# Patient Record
Sex: Female | Born: 1995 | Race: Black or African American | Hispanic: No | Marital: Single | Smoking: Never smoker
Health system: Southern US, Community
[De-identification: ages and names within clinical notes are randomized; demographics above are authoritative.]

## PROBLEM LIST (undated history)

## (undated) DIAGNOSIS — T7840XA Allergy, unspecified, initial encounter: Secondary | ICD-10-CM

## (undated) DIAGNOSIS — L309 Dermatitis, unspecified: Secondary | ICD-10-CM

## (undated) HISTORY — PX: WISDOM TOOTH EXTRACTION: SHX21

## (undated) HISTORY — DX: Allergy, unspecified, initial encounter: T78.40XA

## (undated) HISTORY — DX: Dermatitis, unspecified: L30.9

---
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
## 2015-01-03 ENCOUNTER — Ambulatory Visit: Payer: Self-pay

## 2015-01-07 ENCOUNTER — Telehealth: Payer: Self-pay | Admitting: Family Medicine

## 2015-01-07 ENCOUNTER — Other Ambulatory Visit: Payer: Self-pay

## 2015-01-07 MED ORDER — MEDROXYPROGESTERONE ACETATE 150 MG/ML IM SUSP: 150.0000 mg | INTRAMUSCULAR | Status: DC

## 2015-01-07 NOTE — Telephone Encounter (Signed)
Pt needs refill on her Depo-Provera 150mg /ml to [facility] [place].  Pt is scheduled to come in Wed. 07/7626@ 11:15am to have injection administered.

## 2015-01-09 ENCOUNTER — Ambulatory Visit: Payer: Self-pay

## 2015-01-14 ENCOUNTER — Ambulatory Visit: Payer: Medicaid Other

## 2015-01-14 DIAGNOSIS — Z3200 Encounter for pregnancy test, result unknown: Secondary | ICD-10-CM

## 2015-01-14 DIAGNOSIS — Z3049 Encounter for surveillance of other contraceptives: Secondary | ICD-10-CM

## 2015-01-14 NOTE — Progress Notes (Signed)
Erroneous encounter

## 2015-01-15 ENCOUNTER — Ambulatory Visit: Payer: Medicaid Other

## 2015-01-16 ENCOUNTER — Ambulatory Visit (INDEPENDENT_AMBULATORY_CARE_PROVIDER_SITE_OTHER): Payer: Medicaid Other

## 2015-01-16 DIAGNOSIS — Z3200 Encounter for pregnancy test, result unknown: Secondary | ICD-10-CM

## 2015-01-16 DIAGNOSIS — Z309 Encounter for contraceptive management, unspecified: Secondary | ICD-10-CM | POA: Diagnosis not present

## 2015-01-16 MED ORDER — MEDROXYPROGESTERONE ACETATE 150 MG/ML IM SUSP
150.0000 mg | Freq: Once | INTRAMUSCULAR | Status: AC
  Administered 2015-01-16: 150 mg via INTRAMUSCULAR

## 2015-04-17 ENCOUNTER — Ambulatory Visit (INDEPENDENT_AMBULATORY_CARE_PROVIDER_SITE_OTHER): Payer: Medicaid Other

## 2015-04-17 DIAGNOSIS — Z3049 Encounter for surveillance of other contraceptives: Secondary | ICD-10-CM

## 2015-04-17 DIAGNOSIS — Z3042 Encounter for surveillance of injectable contraceptive: Secondary | ICD-10-CM

## 2015-04-17 MED ORDER — MEDROXYPROGESTERONE ACETATE 150 MG/ML IM SUSP
150.0000 mg | Freq: Once | INTRAMUSCULAR | Status: AC
  Administered 2015-04-17: 150 mg via INTRAMUSCULAR

## 2015-05-29 ENCOUNTER — Telehealth: Payer: Self-pay

## 2015-05-29 NOTE — Telephone Encounter (Signed)
Tried to contact this patient to see if she was interested in getting her influenza vaccine, but there was no answer. A message was left for her to give [place] a call when she got the chance.

## 2015-07-11 ENCOUNTER — Ambulatory Visit: Payer: Medicaid Other

## 2015-07-11 DIAGNOSIS — Z3049 Encounter for surveillance of other contraceptives: Secondary | ICD-10-CM

## 2015-07-11 MED ORDER — MEDROXYPROGESTERONE ACETATE 150 MG/ML IM SUSP
150.0000 mg | Freq: Once | INTRAMUSCULAR | Status: AC
  Administered 2015-07-11: 150 mg via INTRAMUSCULAR

## 2015-07-18 ENCOUNTER — Ambulatory Visit: Payer: Medicaid Other

## 2015-10-08 ENCOUNTER — Ambulatory Visit (INDEPENDENT_AMBULATORY_CARE_PROVIDER_SITE_OTHER): Payer: Medicaid Other

## 2015-10-08 DIAGNOSIS — Z308 Encounter for other contraceptive management: Secondary | ICD-10-CM

## 2015-10-08 MED ORDER — MEDROXYPROGESTERONE ACETATE 150 MG/ML IM SUSP
150.0000 mg | Freq: Once | INTRAMUSCULAR | Status: AC
  Administered 2015-10-08: 150 mg via INTRAMUSCULAR

## 2015-12-05 ENCOUNTER — Ambulatory Visit (INDEPENDENT_AMBULATORY_CARE_PROVIDER_SITE_OTHER): Payer: 59 | Admitting: Family Medicine

## 2015-12-05 ENCOUNTER — Encounter: Payer: Self-pay | Admitting: Family Medicine

## 2015-12-05 DIAGNOSIS — J302 Other seasonal allergic rhinitis: Secondary | ICD-10-CM | POA: Insufficient documentation

## 2015-12-05 DIAGNOSIS — L219 Seborrheic dermatitis, unspecified: Secondary | ICD-10-CM

## 2015-12-05 DIAGNOSIS — J3089 Other allergic rhinitis: Secondary | ICD-10-CM

## 2015-12-05 DIAGNOSIS — Z91010 Allergy to peanuts: Secondary | ICD-10-CM | POA: Insufficient documentation

## 2015-12-05 DIAGNOSIS — E559 Vitamin D deficiency, unspecified: Secondary | ICD-10-CM | POA: Insufficient documentation

## 2015-12-05 DIAGNOSIS — L210 Seborrhea capitis: Secondary | ICD-10-CM

## 2015-12-05 DIAGNOSIS — L309 Dermatitis, unspecified: Secondary | ICD-10-CM | POA: Insufficient documentation

## 2015-12-05 MED ORDER — CLOBETASOL PROPIONATE 0.05 % EX SHAM: 15.0000 mL | MEDICATED_SHAMPOO | CUTANEOUS | Status: DC

## 2015-12-05 MED ORDER — SELENIUM SULFIDE 2.25 % EX SHAM
10.0000 mL | MEDICATED_SHAMPOO | CUTANEOUS | Status: DC
Start: 2015-12-05 — End: 2016-07-08

## 2015-12-05 NOTE — Progress Notes (Signed)
Name: Shelley Davis   MRN: [id number]    DOB: 1995-06-29   Date:12/05/2015       Progress Note  Subjective  Chief Complaint  Chief Complaint  Patient presents with  . Hair/Scalp Problem    patient stated that she has to wash her hair 2/week due to lots of flakes. she stated that it turns really red when she scratches it. she also mentioned that she has a hx of eczema    HPI  Seborrhea capitis: long history of dandruff, getting progressively worse, large flakes, at times scalp irritated and burns. She has tried otc medication without improvement of symptoms.    Patient Active Problem List   Diagnosis Date Noted  . Perennial allergic rhinitis with seasonal variation 12/05/2015  . Allergic to peanuts 12/05/2015  . Vitamin D deficiency 12/05/2015  . Eczema 12/05/2015    Past Surgical History  Procedure Laterality Date  . Wisdom tooth extraction Bilateral     Family History  Problem Relation Age of Onset  . Asthma Sister     Social History   Social History  . Marital Status: Single    Spouse Name: N/A  . Number of Children: N/A  . Years of Education: N/A   Occupational History  . Not on file.   Social History Main Topics  . Smoking status: Never Smoker   . Smokeless tobacco: Never Used  . Alcohol Use: No  . Drug Use: No  . Sexual Activity:    Partners: Male   Other Topics Concern  . Not on file   Social History Narrative  . No narrative on file     Current outpatient prescriptions:  .  EPINEPHrine (EPIPEN 2-PAK) 0.3 mg/0.3 mL IJ SOAJ injection, , Disp: , Rfl:  .  medroxyPROGESTERone (DEPO-PROVERA) 150 MG/ML injection, Inject 1 mL (150 mg total) into the muscle every 3 (three) months., Disp: 1 mL, Rfl: 4 .  Clobetasol Propionate 0.05 % shampoo, Apply 15 mLs topically 2 (two) times a week., Disp: 118 mL, Rfl: 0 .  Selenium Sulfide 2.25 % SHAM, Apply 10 mLs topically 2 (two) times a week., Disp: 180 mL, Rfl: 2  Allergies  Allergen Reactions  . Peanut Oil       ROS  Ten systems reviewed and is negative except as mentioned in HPI   Objective  Filed Vitals:   12/05/15 0827  BP: 102/62  Pulse: 98  Temp: 98.5 F (36.9 C)  TempSrc: Oral  Resp: 16  Height: 5' (1.524 m)  Weight: 151 lb 4.8 oz (68.629 kg)  SpO2: 97%    Body mass index is 29.55 kg/(m^2).  Physical Exam Constitutional: Patient appears well-developed No distress.  HEENT: head atraumatic, normocephalic, pupils equal and reactive to light,  neck supple, throat within normal limits Cardiovascular: Normal rate, regular rhythm and normal heart sounds.  No murmur heard. No BLE edema. Pulmonary/Chest: Effort normal and breath sounds normal. No respiratory distress. Abdominal: Soft.  There is no tenderness. Psychiatric: Patient has a normal mood and affect. behavior is normal. Judgment and thought content normal. Skin: large white flakes that is worse on hair lines and nuchal area, with clumps on scalp  PHQ2/9: Depression screen PHQ 2/9 12/05/2015  Decreased Interest 0  Down, Depressed, Hopeless 0  PHQ - 2 Score 0     Fall Risk: Fall Risk  12/05/2015  Falls in the past year? No     Functional Status Survey: Is the patient deaf or have difficulty hearing?: No  Does the patient have difficulty seeing, even when wearing glasses/contacts?: No Does the patient have difficulty concentrating, remembering, or making decisions?: No Does the patient have difficulty walking or climbing stairs?: No Does the patient have difficulty dressing or bathing?: No Does the patient have difficulty doing errands alone such as visiting a doctor's office or shopping?: No    Assessment & Plan  1. Seborrhea capitis  - Selenium Sulfide 2.25 % SHAM; Apply 10 mLs topically 2 (two) times a week.  Dispense: 180 mL; Refill: 2 - Clobetasol Propionate 0.05 % shampoo; Apply 15 mLs topically 2 (two) times a week.  Dispense: 118 mL; Refill: 0

## 2016-01-08 ENCOUNTER — Ambulatory Visit: Payer: Medicaid Other

## 2016-01-17 ENCOUNTER — Ambulatory Visit (INDEPENDENT_AMBULATORY_CARE_PROVIDER_SITE_OTHER): Payer: 59

## 2016-01-17 DIAGNOSIS — Z3042 Encounter for surveillance of injectable contraceptive: Secondary | ICD-10-CM

## 2016-01-17 DIAGNOSIS — Z3049 Encounter for surveillance of other contraceptives: Secondary | ICD-10-CM

## 2016-01-17 LAB — POCT URINE PREGNANCY: Preg Test, Ur: NEGATIVE

## 2016-01-17 MED ORDER — MEDROXYPROGESTERONE ACETATE 150 MG/ML IM SUSY
150.0000 mg | PREFILLED_SYRINGE | Freq: Once | INTRAMUSCULAR | Status: AC
  Administered 2016-01-17: 150 mg via INTRAMUSCULAR

## 2016-02-07 ENCOUNTER — Encounter: Payer: 59 | Admitting: Family Medicine

## 2016-04-14 ENCOUNTER — Other Ambulatory Visit: Payer: Self-pay | Admitting: Family Medicine

## 2016-04-15 ENCOUNTER — Ambulatory Visit: Payer: 59

## 2016-04-15 NOTE — Telephone Encounter (Signed)
[facility] 

## 2016-04-20 ENCOUNTER — Ambulatory Visit: Payer: 59

## 2016-04-22 NOTE — Telephone Encounter (Signed)
Annual physical scheduled for Feb.

## 2016-04-23 ENCOUNTER — Ambulatory Visit (INDEPENDENT_AMBULATORY_CARE_PROVIDER_SITE_OTHER): Payer: 59

## 2016-04-23 DIAGNOSIS — Z3042 Encounter for surveillance of injectable contraceptive: Secondary | ICD-10-CM

## 2016-04-23 MED ORDER — MEDROXYPROGESTERONE ACETATE 150 MG/ML IM SUSP
150.0000 mg | Freq: Once | INTRAMUSCULAR | Status: AC
  Administered 2016-04-23: 150 mg via INTRAMUSCULAR

## 2016-07-08 ENCOUNTER — Other Ambulatory Visit: Payer: Self-pay | Admitting: Family Medicine

## 2016-07-08 ENCOUNTER — Ambulatory Visit (INDEPENDENT_AMBULATORY_CARE_PROVIDER_SITE_OTHER): Payer: 59 | Admitting: Family Medicine

## 2016-07-08 ENCOUNTER — Encounter: Payer: Self-pay | Admitting: Family Medicine

## 2016-07-08 VITALS — BP 118/74 | HR 96 | Temp 97.8°F | Resp 16 | Ht 61.0 in | Wt 151.9 lb

## 2016-07-08 DIAGNOSIS — Z113 Encounter for screening for infections with a predominantly sexual mode of transmission: Secondary | ICD-10-CM

## 2016-07-08 DIAGNOSIS — Z01419 Encounter for gynecological examination (general) (routine) without abnormal findings: Secondary | ICD-10-CM | POA: Diagnosis not present

## 2016-07-08 DIAGNOSIS — N944 Primary dysmenorrhea: Secondary | ICD-10-CM

## 2016-07-08 DIAGNOSIS — R1110 Vomiting, unspecified: Secondary | ICD-10-CM | POA: Diagnosis not present

## 2016-07-08 DIAGNOSIS — Z23 Encounter for immunization: Secondary | ICD-10-CM | POA: Diagnosis not present

## 2016-07-08 DIAGNOSIS — E663 Overweight: Secondary | ICD-10-CM | POA: Diagnosis not present

## 2016-07-08 DIAGNOSIS — R5383 Other fatigue: Secondary | ICD-10-CM

## 2016-07-08 DIAGNOSIS — Z13220 Encounter for screening for lipoid disorders: Secondary | ICD-10-CM | POA: Diagnosis not present

## 2016-07-08 DIAGNOSIS — D229 Melanocytic nevi, unspecified: Secondary | ICD-10-CM | POA: Diagnosis not present

## 2016-07-08 DIAGNOSIS — E559 Vitamin D deficiency, unspecified: Secondary | ICD-10-CM | POA: Diagnosis not present

## 2016-07-08 DIAGNOSIS — IMO0001 Reserved for inherently not codable concepts without codable children: Secondary | ICD-10-CM

## 2016-07-08 DIAGNOSIS — Z124 Encounter for screening for malignant neoplasm of cervix: Secondary | ICD-10-CM

## 2016-07-08 DIAGNOSIS — Z3042 Encounter for surveillance of injectable contraceptive: Secondary | ICD-10-CM | POA: Diagnosis not present

## 2016-07-08 LAB — CBC WITH DIFFERENTIAL/PLATELET
BASOS ABS: 0 {cells}/uL (ref 0–200)
Basophils Relative: 0 %
EOS PCT: 3 %
Eosinophils Absolute: 171 cells/uL (ref 15–500)
HEMATOCRIT: 39.4 % (ref 35.0–45.0)
Hemoglobin: 12.8 g/dL (ref 11.7–15.5)
LYMPHS PCT: 44 %
Lymphs Abs: 2508 cells/uL (ref 850–3900)
MCH: 25.2 pg — AB (ref 27.0–33.0)
MCHC: 32.5 g/dL (ref 32.0–36.0)
MCV: 77.7 fL — ABNORMAL LOW (ref 80.0–100.0)
MONO ABS: 513 {cells}/uL (ref 200–950)
MPV: 8.5 fL (ref 7.5–12.5)
Monocytes Relative: 9 %
NEUTROS PCT: 44 %
Neutro Abs: 2508 cells/uL (ref 1500–7800)
Platelets: 358 10*3/uL (ref 140–400)
RBC: 5.07 MIL/uL (ref 3.80–5.10)
RDW: 14.6 % (ref 11.0–15.0)
WBC: 5.7 10*3/uL (ref 3.8–10.8)

## 2016-07-08 MED ORDER — RANITIDINE HCL 150 MG PO TABS: 150.0000 mg | ORAL_TABLET | Freq: Two times a day (BID) | ORAL | 0 refills | Status: DC

## 2016-07-08 MED ORDER — MEDROXYPROGESTERONE ACETATE 150 MG/ML IM SUSY: 1.0000 mL | PREFILLED_SYRINGE | INTRAMUSCULAR | 4 refills | Status: DC

## 2016-07-08 NOTE — Patient Instructions (Signed)
Gastroesophageal Reflux Disease, Adult Normally, food travels down the esophagus and stays in the stomach to be digested. However, when a person has gastroesophageal reflux disease (GERD), food and stomach acid move back up into the esophagus. When this happens, the esophagus becomes sore and inflamed. Over time, GERD can create small holes (ulcers) in the lining of the esophagus. What are the causes? This condition is caused by a problem with the muscle between the esophagus and the stomach (lower esophageal sphincter, or LES). Normally, the LES muscle closes after food passes through the esophagus to the stomach. When the LES is weakened or abnormal, it does not close properly, and that allows food and stomach acid to go back up into the esophagus. The LES can be weakened by certain dietary substances, medicines, and medical conditions, including:  Tobacco use.  Pregnancy.  Having a hiatal hernia.  Heavy alcohol use.  Certain foods and beverages, such as coffee, chocolate, onions, and peppermint.  What increases the risk? This condition is more likely to develop in:  People who have an increased body weight.  People who have connective tissue disorders.  People who use NSAID medicines.  What are the signs or symptoms? Symptoms of this condition include:  Heartburn.  Difficult or painful swallowing.  The feeling of having a lump in the throat.  Abitter taste in the mouth.  Bad breath.  Having a large amount of saliva.  Having an upset or bloated stomach.  Belching.  Chest pain.  Shortness of breath or wheezing.  Ongoing (chronic) cough or a night-time cough.  Wearing away of tooth enamel.  Weight loss.  Different conditions can cause chest pain. Make sure to see your health care provider if you experience chest pain. How is this diagnosed? Your health care provider will take a medical history and perform a physical exam. To determine if you have mild or severe  GERD, your health care provider may also monitor how you respond to treatment. You may also have other tests, including:  An endoscopy toexamine your stomach and esophagus with a small camera.  A test thatmeasures the acidity level in your esophagus.  A test thatmeasures how much pressure is on your esophagus.  A barium swallow or modified barium swallow to show the shape, size, and functioning of your esophagus.  How is this treated? The goal of treatment is to help relieve your symptoms and to prevent complications. Treatment for this condition may vary depending on how severe your symptoms are. Your health care provider may recommend:  Changes to your diet.  Medicine.  Surgery.  Follow these instructions at home: Diet  Follow a diet as recommended by your health care provider. This may involve avoiding foods and drinks such as: ? Coffee and tea (with or without caffeine). ? Drinks that containalcohol. ? Energy drinks and sports drinks. ? Carbonated drinks or sodas. ? Chocolate and cocoa. ? Peppermint and mint flavorings. ? Garlic and onions. ? Horseradish. ? Spicy and acidic foods, including peppers, chili powder, curry powder, vinegar, hot sauces, and barbecue sauce. ? Citrus fruit juices and citrus fruits, such as oranges, lemons, and limes. ? Tomato-based foods, such as red sauce, chili, salsa, and pizza with red sauce. ? Fried and fatty foods, such as donuts, french fries, potato chips, and high-fat dressings. ? High-fat meats, such as hot dogs and fatty cuts of red and white meats, such as rib eye steak, sausage, ham, and bacon. ? High-fat dairy items, such as whole milk,   butter, and cream cheese.  Eat small, frequent meals instead of large meals.  Avoid drinking large amounts of liquid with your meals.  Avoid eating meals during the 2-3 hours before bedtime.  Avoid lying down right after you eat.  Do not exercise right after you eat. General  instructions  Pay attention to any changes in your symptoms.  Take over-the-counter and prescription medicines only as told by your health care provider. Do not take aspirin, ibuprofen, or other NSAIDs unless your health care provider told you to do so.  Do not use any tobacco products, including cigarettes, chewing tobacco, and e-cigarettes. If you need help quitting, ask your health care provider.  Wear loose-fitting clothing. Do not wear anything tight around your waist that causes pressure on your abdomen.  Raise (elevate) the head of your bed 6 inches (15cm).  Try to reduce your stress, such as with yoga or meditation. If you need help reducing stress, ask your health care provider.  If you are overweight, reduce your weight to an amount that is healthy for you. Ask your health care provider for guidance about a safe weight loss goal.  Keep all follow-up visits as told by your health care provider. This is important. Contact a health care provider if:  You have new symptoms.  You have unexplained weight loss.  You have difficulty swallowing, or it hurts to swallow.  You have wheezing or a persistent cough.  Your symptoms do not improve with treatment.  You have a hoarse voice. Get help right away if:  You have pain in your arms, neck, jaw, teeth, or back.  You feel sweaty, dizzy, or light-headed.  You have chest pain or shortness of breath.  You vomit and your vomit looks like blood or coffee grounds.  You faint.  Your stool is bloody or black.  You cannot swallow, drink, or eat. This information is not intended to replace advice given to you by your health care provider. Make sure you discuss any questions you have with your health care provider. Document Released: 02/18/2005 Document Revised: 10/09/2015 Document Reviewed: 09/05/2014 Elsevier Interactive Patient Education  2017 [employer]  

## 2016-07-08 NOTE — Progress Notes (Signed)
Name: Shelley Davis   MRN: [id number]    DOB: 1996-01-24   Date:07/08/2016       Progress Note  Subjective  Chief Complaint  Chief Complaint  Patient presents with  . Annual Exam    HPI   Well Woman: she is sexually active, same partner for over one year, she does not have a history of STI. She is on Depo and also uses condoms. She drinks seldom and does not get drunk. She works two jobs, but is eating junk food and not exercising  Primary dysmenorrhea: she started Depo in 2015 for heavy and painful cycles. Doing well on medication, dose not recall LMP. She is aware that depo can cause bone loss. Explained importance of high calcium diet and vitamin D supplementation  Regurgitation: she has noticed that after meals she has a gagging sensation and fullness sensation on her chest, and is getting progressively worse, now it happens when not eating and also feels like she needs to catch her breath. No heartburn or weight loss, no abdominal pain or blood in stools. No change in bowel movements. No family history of thyroid disease  Fatigue: she feels tired at time, not taking any supplementation    Patient Active Problem List   Diagnosis Date Noted  . Primary dysmenorrhea 07/08/2016  . Perennial allergic rhinitis with seasonal variation 12/05/2015  . Allergic to peanuts 12/05/2015  . Vitamin D deficiency 12/05/2015  . Eczema 12/05/2015    Past Surgical History:  Procedure Laterality Date  . WISDOM TOOTH EXTRACTION Bilateral     Family History  Problem Relation Age of Onset  . Hypertension Mother   . Asthma Sister   . Lung disease Maternal Grandmother     Social History   Social History  . Marital status: Single    Spouse name: N/A  . Number of children: N/A  . Years of education: N/A   Occupational History  . 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 Smoking status: Never Smoker  . Smokeless tobacco: Never Used  . Alcohol use 0.6  oz/week    1 Shots of liquor per week     Comment: mixed drink occasionally  . Drug use: No  . Sexual activity: Yes    Partners: Male    Birth control/ protection: Injection   Other Topics Concern  . Not on file   Social History Narrative   She works two jobs, living with sister as room-mates   Dating [place] since end of 2016     Current Outpatient Prescriptions:  .  EPINEPHrine (EPIPEN 2-PAK) 0.3 mg/0.3 mL IJ SOAJ injection, , Disp: , Rfl:  .  MedroxyPROGESTERone Acetate 150 MG/ML SUSY, INJECT 1 MILLILITERS INTO MUSCLE EVERY 3 MONTHS, Disp: , Rfl: 0  Allergies  Allergen Reactions  . Peanut Oil      ROS  Constitutional: Negative for fever or weight change.  Respiratory: Negative for cough and intermittent  shortness of breath - feels like she needs to catch her breath    Cardiovascular: Negative for chest pain or palpitations.  Gastrointestinal: Negative for abdominal pain, no bowel changes.  Musculoskeletal: Negative for gait problem or joint swelling.  Skin: Negative for rash.  Neurological: Negative for dizziness or headache.  No other specific complaints in a complete review of systems (except as listed in HPI above).  Objective  Vitals:   07/08/16 0827  BP: 118/74  Pulse: 96  Resp:  16  Temp: 97.8 F (36.6 C)  TempSrc: Oral  SpO2: 99%  Weight: 151 lb 14.4 oz (68.9 kg)  Height: 5\' 1"  (1.549 m)    Body mass index is 28.7 kg/m.  Physical Exam  Constitutional: Patient appears well-developed and well-nourished. No distress.  HENT: Head: Normocephalic and atraumatic. Ears: B TMs ok, no erythema or effusion; Nose: Nose normal. Mouth/Throat: Oropharynx is clear and moist. No oropharyngeal exudate.  Eyes: Conjunctivae and EOM are normal. Pupils are equal, round, and reactive to light. No scleral icterus.  Neck: Normal range of motion. Neck supple. No JVD present. No thyromegaly present.  Cardiovascular: Normal rate, regular rhythm and normal heart sounds.  No  murmur heard. No BLE edema. Pulmonary/Chest: Effort normal and breath sounds normal. No respiratory distress. Abdominal: Soft. Bowel sounds are normal, no distension. There is no tenderness. no masses Breast: no lumps or masses, no nipple discharge or rashes FEMALE GENITALIA:  External genitalia normal External urethra normal Vaginal vault normal without discharge or lesions Cervix normal without discharge or lesions Bimanual exam normal without masses RECTAL: not done Musculoskeletal: Normal range of motion, no joint effusions. No gross deformities Neurological: he is alert and oriented to person, place, and time. No cranial nerve deficit. Coordination, balance, strength, speech and gait are normal. Skin: Skin is warm and dry. No rash noted. No erythema. Multiple moles - irregular on the back Psychiatric: Patient has a normal mood and affect. behavior is normal. Judgment and thought content normal.   PHQ2/9: Depression screen [facility] 2/9 07/08/2016 12/05/2015  Decreased Interest 0 0  Down, Depressed, Hopeless 0 0  PHQ - 2 Score 0 0     Fall Risk: Fall Risk  07/08/2016 12/05/2015  Falls in the past year? No No    Functional Status Survey: Is the patient deaf or have difficulty hearing?: No Does the patient have difficulty seeing, even when wearing glasses/contacts?: No Does the patient have difficulty concentrating, remembering, or making decisions?: No Does the patient have difficulty walking or climbing stairs?: No Does the patient have difficulty dressing or bathing?: No Does the patient have difficulty doing errands alone such as visiting a doctor's office or shopping?: No   Assessment & Plan  1. Well woman exam  Discussed importance of 150 minutes of physical activity weekly, eat two servings of fish weekly, eat one serving of tree nuts ( cashews, pistachios, pecans, almonds.Marland Kitchen) every other day, eat 6 servings of fruit/vegetables daily and drink plenty of water and avoid sweet  beverages.  - Lipid panel - Hemoglobin A1c - Insulin, fasting - COMPLETE METABOLIC PANEL WITH GFR - CBC with Differential/Platelet - VITAMIN D 25 Hydroxy (Vit-D Deficiency, Fractures) - Vitamin B12 - TSH  2. Primary dysmenorrhea  - MedroxyPROGESTERone Acetate 150 MG/ML SUSY; Inject 1 mL (150 mg total) into the muscle every 3 (three) months.  Dispense: 1 mL; Refill: 4  3. Encounter for surveillance of injectable contraceptive  - MedroxyPROGESTERone Acetate 150 MG/ML SUSY; Inject 1 mL (150 mg total) into the muscle every 3 (three) months.  Dispense: 1 mL; Refill: 4  4. Overweight (BMI 25.0-29.9)  - Hemoglobin A1c - Insulin, fasting  5. Vitamin D deficiency  -Vitamin D   6. Other fatigue  - COMPLETE METABOLIC PANEL WITH GFR - CBC with Differential/Platelet - VITAMIN D 25 Hydroxy (Vit-D Deficiency, Fractures) - Vitamin B12 - TSH  7. Regurgitation  - ranitidine (ZANTAC) 150 MG tablet; Take 1 tablet (150 mg total) by mouth 2 (two) times daily.  Dispense: 60 tablet; Refill: 0  8. Cervical cancer screening  - Pap IG and Chlamydia/Gonococcus, NAA  9. Lipid screening  - Lipid panel  10. Routine screening for STI (sexually transmitted infection)  - HIV antibody - RPR - Hepatitis, Acute  11. Needs flu shot  - Flu Vaccine QUAD 36+ mos IM   12. Numerous moles  - Ambulatory referral to Dermatology

## 2016-07-09 LAB — LIPID PANEL
CHOL/HDL RATIO: 2.6 ratio (ref <5.0)
CHOLESTEROL: 184 mg/dL (ref <200)
HDL: 70 mg/dL (ref >50)
LDL Cholesterol: 106 mg/dL — ABNORMAL HIGH (ref <100)
TRIGLYCERIDES: 38 mg/dL (ref <150)
VLDL: 8 mg/dL (ref <30)

## 2016-07-09 LAB — COMPLETE METABOLIC PANEL WITH GFR
ALBUMIN: 4.6 g/dL (ref 3.6–5.1)
ALK PHOS: 73 U/L (ref 33–115)
ALT: 8 U/L (ref 6–29)
AST: 14 U/L (ref 10–30)
BUN: 11 mg/dL (ref 7–25)
CALCIUM: 9.9 mg/dL (ref 8.6–10.2)
CO2: 28 mmol/L (ref 20–31)
CREATININE: 0.81 mg/dL (ref 0.50–1.10)
Chloride: 102 mmol/L (ref 98–110)
GFR, Est African American: >89 mL/min (ref >=60)
GFR, Est Non African American: >89 mL/min (ref >=60)
GLUCOSE: 87 mg/dL (ref 65–99)
Potassium: 4.1 mmol/L (ref 3.5–5.3)
SODIUM: 137 mmol/L (ref 135–146)
Total Bilirubin: 0.6 mg/dL (ref 0.2–1.2)
Total Protein: 7.4 g/dL (ref 6.1–8.1)

## 2016-07-09 LAB — INSULIN, FASTING: Insulin fasting, serum: 5.7 u[IU]/mL (ref 2.0–19.6)

## 2016-07-09 LAB — HEPATITIS PANEL, ACUTE
HCV Ab: NEGATIVE
HEP A IGM: NONREACTIVE
HEP B S AG: NEGATIVE
Hep B C IgM: NONREACTIVE

## 2016-07-09 LAB — VITAMIN B12: VITAMIN B 12: 313 pg/mL (ref 200–1100)

## 2016-07-09 LAB — HEMOGLOBIN A1C
Hgb A1c MFr Bld: 5.4 % (ref <5.7)
Mean Plasma Glucose: 108 mg/dL

## 2016-07-09 LAB — TSH: TSH: 1.4 mIU/L

## 2016-07-10 LAB — VITAMIN D 25 HYDROXY (VIT D DEFICIENCY, FRACTURES): Vit D, 25-Hydroxy: 12 ng/mL — ABNORMAL LOW (ref 30–100)

## 2016-07-10 LAB — PAP IG AND CT-NG NAA
Chlamydia Probe Amp: NOT DETECTED
GC PROBE AMP: NOT DETECTED

## 2016-07-10 LAB — HIV ANTIBODY (ROUTINE TESTING W REFLEX): HIV 1&2 Ab, 4th Generation: NONREACTIVE

## 2016-07-11 LAB — RPR

## 2016-07-12 ENCOUNTER — Other Ambulatory Visit: Payer: Self-pay | Admitting: Family Medicine

## 2016-07-12 MED ORDER — VITAMIN D (ERGOCALCIFEROL) 1.25 MG (50000 UNIT) PO CAPS: 50000.0000 [IU] | ORAL_CAPSULE | ORAL | 0 refills | Status: DC

## 2016-07-22 ENCOUNTER — Ambulatory Visit (INDEPENDENT_AMBULATORY_CARE_PROVIDER_SITE_OTHER): Payer: 59

## 2016-07-22 DIAGNOSIS — Z3042 Encounter for surveillance of injectable contraceptive: Secondary | ICD-10-CM

## 2016-07-22 MED ORDER — MEDROXYPROGESTERONE ACETATE 150 MG/ML IM SUSP
150.0000 mg | Freq: Once | INTRAMUSCULAR | Status: AC
  Administered 2016-07-22: 150 mg via INTRAMUSCULAR

## 2016-08-05 ENCOUNTER — Ambulatory Visit (INDEPENDENT_AMBULATORY_CARE_PROVIDER_SITE_OTHER): Payer: 59 | Admitting: Family Medicine

## 2016-08-05 ENCOUNTER — Encounter: Payer: Self-pay | Admitting: Family Medicine

## 2016-08-05 VITALS — BP 122/68 | HR 90 | Temp 98.6°F | Resp 18 | Ht 61.0 in | Wt 154.4 lb

## 2016-08-05 DIAGNOSIS — IMO0001 Reserved for inherently not codable concepts without codable children: Secondary | ICD-10-CM

## 2016-08-05 DIAGNOSIS — J4521 Mild intermittent asthma with (acute) exacerbation: Secondary | ICD-10-CM | POA: Diagnosis not present

## 2016-08-05 DIAGNOSIS — K219 Gastro-esophageal reflux disease without esophagitis: Secondary | ICD-10-CM | POA: Diagnosis not present

## 2016-08-05 DIAGNOSIS — J302 Other seasonal allergic rhinitis: Secondary | ICD-10-CM | POA: Diagnosis not present

## 2016-08-05 DIAGNOSIS — N944 Primary dysmenorrhea: Secondary | ICD-10-CM

## 2016-08-05 DIAGNOSIS — R7989 Other specified abnormal findings of blood chemistry: Secondary | ICD-10-CM

## 2016-08-05 DIAGNOSIS — R05 Cough: Secondary | ICD-10-CM | POA: Diagnosis not present

## 2016-08-05 DIAGNOSIS — R1110 Vomiting, unspecified: Secondary | ICD-10-CM | POA: Diagnosis not present

## 2016-08-05 DIAGNOSIS — E559 Vitamin D deficiency, unspecified: Secondary | ICD-10-CM

## 2016-08-05 DIAGNOSIS — J3089 Other allergic rhinitis: Secondary | ICD-10-CM | POA: Diagnosis not present

## 2016-08-05 DIAGNOSIS — R059 Cough, unspecified: Secondary | ICD-10-CM

## 2016-08-05 DIAGNOSIS — E538 Deficiency of other specified B group vitamins: Secondary | ICD-10-CM | POA: Insufficient documentation

## 2016-08-05 DIAGNOSIS — J4520 Mild intermittent asthma, uncomplicated: Secondary | ICD-10-CM | POA: Insufficient documentation

## 2016-08-05 MED ORDER — RANITIDINE HCL 150 MG PO TABS: 150.0000 mg | ORAL_TABLET | Freq: Two times a day (BID) | ORAL | 2 refills | Status: DC

## 2016-08-05 MED ORDER — ALBUTEROL SULFATE HFA 108 (90 BASE) MCG/ACT IN AERS: 2.0000 | INHALATION_SPRAY | Freq: Four times a day (QID) | RESPIRATORY_TRACT | 0 refills | Status: DC | PRN

## 2016-08-05 MED ORDER — LORATADINE 10 MG PO TABS: 10.0000 mg | ORAL_TABLET | Freq: Every day | ORAL | 0 refills | Status: DC

## 2016-08-05 MED ORDER — FLUTICASONE PROPIONATE 50 MCG/ACT NA SUSP: 2.0000 | Freq: Every day | NASAL | 2 refills | Status: DC

## 2016-08-05 MED ORDER — MONTELUKAST SODIUM 10 MG PO TABS: 10.0000 mg | ORAL_TABLET | Freq: Every day | ORAL | 1 refills | Status: DC

## 2016-08-05 MED ORDER — CYANOCOBALAMIN 1000 MCG/ML IJ SOLN
1000.0000 ug | Freq: Once | INTRAMUSCULAR | Status: AC
  Administered 2016-08-05: 1000 ug via INTRAMUSCULAR

## 2016-08-05 NOTE — Progress Notes (Signed)
Name: Shelley Davis   MRN: [id number]    DOB: December 31, 1995   Date:08/05/2016       Progress Note  Subjective  Chief Complaint  Chief Complaint  Patient presents with  . Follow-up    1 month   . Regurgitation    Still having trouble after eating, will cough so hard she will regurgiation her food.   . Allergies    Denies any symptoms, states her symptoms will flair up around summer time due to heat    HPI    Regurgitation/GERD: she has noticed that after meals she has a gagging sensation and fullness sensation on her chest, and was getting progressively worse, however we started her on Zantac one month ago and symptoms are not as often now, last episode was this past weekend when she went out to eat. She ate more than usual and had a coughing episode and it caused regurgitation. We will treat asthma and continue Ranitidine for now, return sooner if no improvement   Asthma/AR: she has seen an allergist in the past and diagnosed with intermittent asthma, she also has perennial allergic rhinitis that is worse in the Spring. She has noticed rhinorrhea, nasal congestion, post-nasal drainage and has a dry cough, worse than usual, no wheezing or SOB. Used to take Singulair but has out of medication  Fatigue: she was found to have low Vitamin D and B12 and is getting supplementation now.   Primary dysmenorrhea: she started Depo in 2015 for heavy and painful cycles. Doing well on medication, dose not recall LMP. She is aware that depo can cause bone loss. Explained importance of high calcium diet and vitamin D supplementation  Patient Active Problem List   Diagnosis Date Noted  . Asthma, mild intermittent 08/05/2016  . Low serum vitamin B12 08/05/2016  . Primary dysmenorrhea 07/08/2016  . Perennial allergic rhinitis with seasonal variation 12/05/2015  . Allergic to peanuts 12/05/2015  . Vitamin D deficiency 12/05/2015  . Eczema 12/05/2015    Past Surgical History:  Procedure Laterality Date   . WISDOM TOOTH EXTRACTION Bilateral     Family History  Problem Relation Age of Onset  . Hypertension Mother   . Asthma Sister   . Lung disease Maternal Grandmother     Social History   Social History  . Marital status: Single    Spouse name: N/A  . Number of children: N/A  . Years of education: N/A   Occupational History  . 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 Smoking status: Never Smoker  . Smokeless tobacco: Never Used  . Alcohol use 0.6 oz/week    1 Shots of liquor per week     Comment: mixed drink occasionally  . Drug use: No  . Sexual activity: Yes    Partners: Male    Birth control/ protection: Injection   Other Topics Concern  . Not on file   Social History Narrative   She works two jobs, living with sister as room-mates   Dating [place] since end of 2016     Current Outpatient Prescriptions:  .  EPINEPHrine (EPIPEN 2-PAK) 0.3 mg/0.3 mL IJ SOAJ injection, , Disp: , Rfl:  .  MedroxyPROGESTERone Acetate 150 MG/ML SUSY, Inject 1 mL (150 mg total) into the muscle every 3 (three) months., Disp: 1 mL, Rfl: 4 .  ranitidine (ZANTAC) 150 MG tablet, Take 1 tablet (150 mg total) by mouth 2 (two) times daily.,  Disp: 60 tablet, Rfl: 0 .  Vitamin D, Ergocalciferol, (DRISDOL) 50000 units CAPS capsule, Take 1 capsule (50,000 Units total) by mouth every 7 (seven) days., Disp: 12 capsule, Rfl: 0 .  albuterol (PROAIR HFA) 108 (90 Base) MCG/ACT inhaler, Inhale 2 puffs into the lungs every 6 (six) hours as needed for wheezing or shortness of breath., Disp: 1 Inhaler, Rfl: 0 .  fluticasone (FLONASE) 50 MCG/ACT nasal spray, Place 2 sprays into both nostrils daily., Disp: 16 g, Rfl: 2 .  loratadine (CLARITIN) 10 MG tablet, Take 1 tablet (10 mg total) by mouth daily., Disp: 30 tablet, Rfl: 0 .  montelukast (SINGULAIR) 10 MG tablet, Take 1 tablet (10 mg total) by mouth at bedtime., Disp: 90 tablet, Rfl: 1  Current Facility-Administered  Medications:  .  cyanocobalamin ((VITAMIN B-12)) injection 1,000 mcg, 1,000 mcg, Intramuscular, Once, Steele Sizer, MD  Allergies  Allergen Reactions  . Peanut Oil      ROS  Constitutional: Negative for fever or weight change.  Respiratory: Positive  for cough but no shortness of breath.   Cardiovascular: Negative for chest pain or palpitations.  Gastrointestinal: Negative for abdominal pain, no bowel changes, but has regurgitation .  Musculoskeletal: Negative for gait problem or joint swelling.  Skin: Negative for rash.  Neurological: Negative for dizziness or headache.  No other specific complaints in a complete review of systems (except as listed in HPI above).  Objective  Vitals:   08/05/16 1105  BP: 122/68  Pulse: 90  Resp: 18  Temp: 98.6 F (37 C)  TempSrc: Oral  SpO2: 98%  Weight: 154 lb 6.4 oz (70 kg)  Height: _0  (1.549 m)    Body mass index is 29.17 kg/m.  Physical Exam  Constitutional: Patient appears well-developed and well-nourished. Obese  No distress.  HEENT: head atraumatic, normocephalic, pupils equal and reactive to light, boggy turbinates and clear rhinorrhea, neck supple, throat within normal limits Cardiovascular: Normal rate, regular rhythm and normal heart sounds.  No murmur heard. No BLE edema. Pulmonary/Chest: Effort normal and breath sounds normal. No respiratory distress. Abdominal: Soft.  There is no tenderness. Psychiatric: Patient has a normal mood and affect. behavior is normal. Judgment and thought content normal.  Recent Results (from the past 2160 hour(s))  Lipid panel     Status: Abnormal   Collection Time: 07/08/16  9:01 AM  Result Value Ref Range   Cholesterol 184 <200 mg/dL   Triglycerides 38 <150 mg/dL   HDL 70 >50 mg/dL   Total CHOL/HDL Ratio 2.6 <5.0 Ratio   VLDL 8 <30 mg/dL   LDL Cholesterol 106 (H) <100 mg/dL  Hemoglobin A1c     Status: None   Collection Time: 07/08/16  9:01 AM  Result Value Ref Range   Hgb A1c  MFr Bld 5.4 <5.7 %    Comment:   For the purpose of screening for the presence of diabetes:   <5.7%       Consistent with the absence of diabetes 5.7-6.4 %   Consistent with increased risk for diabetes (prediabetes) >=6.5 %     Consistent with diabetes   This assay result is consistent with a decreased risk of diabetes.   Currently, no consensus exists regarding use of hemoglobin A1c for diagnosis of diabetes in children.   According to American Diabetes Association (ADA) guidelines, hemoglobin A1c <7.0% represents optimal control in non-pregnant diabetic patients. Different metrics may apply to specific patient populations. Standards of Medical Care in Diabetes (ADA).  Mean Plasma Glucose 108 mg/dL  Insulin, fasting     Status: None   Collection Time: 07/08/16  9:01 AM  Result Value Ref Range   Insulin fasting, serum 5.7 2.0 - 19.6 uIU/mL    Comment:   This insulin assay shows strong cross-reactivity for some insulin analogs (lispro, aspart, and glargine) and much lower cross-reactivity with others (detemir, glulisine).   Stimulated Insulin reference intervals were established using the Siemens Immulite assay. These values are provided for general guidance only.   COMPLETE METABOLIC PANEL WITH GFR     Status: None   Collection Time: 07/08/16  9:01 AM  Result Value Ref Range   Sodium 137 135 - 146 mmol/L   Potassium 4.1 3.5 - 5.3 mmol/L   Chloride 102 98 - 110 mmol/L   CO2 28 20 - 31 mmol/L   Glucose, Bld 87 65 - 99 mg/dL   BUN 11 7 - 25 mg/dL   Creat 8.49 1.47 - 6.07 mg/dL   Total Bilirubin 0.6 0.2 - 1.2 mg/dL   Alkaline Phosphatase 73 33 - 115 U/L   AST 14 10 - 30 U/L   ALT 8 6 - 29 U/L   Total Protein 7.4 6.1 - 8.1 g/dL   Albumin 4.6 3.6 - 5.1 g/dL   Calcium 9.9 8.6 - 59.7 mg/dL   GFR, Est African American >89 >=60 mL/min   GFR, Est Non African American >89 >=60 mL/min  CBC with Differential/Platelet     Status: Abnormal   Collection Time: 07/08/16  9:01 AM   Result Value Ref Range   WBC 5.7 3.8 - 10.8 K/uL   RBC 5.07 3.80 - 5.10 MIL/uL   Hemoglobin 12.8 11.7 - 15.5 g/dL   HCT 89.6 45.2 - 73.9 %   MCV 77.7 (L) 80.0 - 100.0 fL   MCH 25.2 (L) 27.0 - 33.0 pg   MCHC 32.5 32.0 - 36.0 g/dL   RDW 95.9 16.1 - 98.8 %   Platelets 358 140 - 400 K/uL   MPV 8.5 7.5 - 12.5 fL   Neutro Abs 2,508 1,500 - 7,800 cells/uL   Lymphs Abs 2,508 850 - 3,900 cells/uL   Monocytes Absolute 513 200 - 950 cells/uL   Eosinophils Absolute 171 15 - 500 cells/uL   Basophils Absolute 0 0 - 200 cells/uL   Neutrophils Relative % 44 %   Lymphocytes Relative 44 %   Monocytes Relative 9 %   Eosinophils Relative 3 %   Basophils Relative 0 %   Smear Review Criteria for review not met   VITAMIN D 25 Hydroxy (Vit-D Deficiency, Fractures)     Status: Abnormal   Collection Time: 07/08/16  9:01 AM  Result Value Ref Range   Vit D, 25-Hydroxy 12 (L) 30 - 100 ng/mL    Comment: Vitamin D Status           25-OH Vitamin D        Deficiency                <20 ng/mL        Insufficiency         20 - 29 ng/mL        Optimal             > or = 30 ng/mL   For 25-OH Vitamin D testing on patients on D2-supplementation and patients for whom quantitation of D2 and D3 fractions is required, the [facility] 25-OH VIT D, (D2,D3), LC/MS/MS is recommended: order  code [phone] (patients > 2 yrs).   Vitamin B12     Status: None   Collection Time: 07/08/16  9:01 AM  Result Value Ref Range   Vitamin B-12 313 200 - 1,100 pg/mL  TSH     Status: None   Collection Time: 07/08/16  9:01 AM  Result Value Ref Range   TSH 1.40 mIU/L    Comment:   Reference Range   > or = 20 Years  0.40-4.50   Pregnancy Range First trimester  0.26-2.66 Second trimester 0.55-2.73 Third trimester  0.43-2.91     HIV antibody     Status: None   Collection Time: 07/08/16  9:25 AM  Result Value Ref Range   HIV 1&2 Ab, 4th Generation NONREACTIVE NONREACTIVE    Comment:   HIV-1 antigen and HIV-1/HIV-2 antibodies  were not detected.  There is no laboratory evidence of HIV infection.   HIV-1/2 Antibody Diff        Not indicated. HIV-1 RNA, Qual TMA          Not indicated.     PLEASE NOTE: This information has been disclosed to you from records whose confidentiality may be protected by state law. If your state requires such protection, then the state law prohibits you from making any further disclosure of the information without the specific written consent of the person to whom it pertains, or as otherwise permitted by law. A general authorization for the release of medical or other information is NOT sufficient for this purpose.   The performance of this assay has not been clinically validated in patients less than 9 years old.   For additional information please refer to http://education.[facility].com/faq/FAQ106.  (This link is being provided for informational/educational purposes only.)     RPR     Status: None   Collection Time: 07/08/16  9:25 AM  Result Value Ref Range   RPR Ser Ql NON REAC NON REAC  Hepatitis, Acute     Status: None   Collection Time: 07/08/16  9:25 AM  Result Value Ref Range   Hepatitis B Surface Ag NEGATIVE NEGATIVE   HCV Ab NEGATIVE NEGATIVE   Hep B C IgM NON REACTIVE NON REACTIVE    Comment: High levels of Hepatitis B Core IgM antibody are detectable during the acute stage of Hepatitis B. This antibody is used to differentiate current from past HBV infection.      Hep A IgM NON REACTIVE NON REACTIVE    Comment:   Effective April 09, 2014, Hepatitis Acute Panel (test code [phone]) will be revised to automatically reflex to the Hepatitis C Viral RNA, Quantitative, Real-Time PCR assay if the Hepatitis C antibody screening result is Reactive. This action is being taken to ensure that the CDC/USPSTF recommended HCV diagnostic algorithm with the appropriate test reflex needed for accurate interpretation is followed.     Pap IG and  Chlamydia/Gonococcus, NAA     Status: None   Collection Time: 07/08/16  9:40 AM  Result Value Ref Range   Specimen adequacy:      Comment: SATISFACTORY.  Endocervical/transformation zone component present.   FINAL DIAGNOSIS:      Comment: - NEGATIVE FOR INTRAEPITHELIAL LESIONS OR MALIGNANCY.    COMMENTS:      Comment: This Pap test has been evaluated with computer assisted technology.   Cytotechnologist:      Comment: JRW, BS CT(ASCP)   QC Cytotechnologist:      Comment: KS, CT(HEW)   Chlamydia Probe Amp NOT  DETECTED     Comment:                    **Normal Reference Range: NOT DETECTED**   This test was performed using the APTIMA COMBO2 Assay ([place].).   The analytical performance characteristics of this assay, when used to test SurePath specimens have been determined by [facility]      GC Probe Amp NOT DETECTED     Comment:                    **Normal Reference Range: NOT DETECTED**   This test was performed using the APTIMA COMBO2 Assay ([place]   The analytical performance characteristics of this assay, when used to test SurePath specimens have been determined by [employer]   *  The Pap is a screening test for cervical cancer. It is not a  diagnostic test and is subject to false negative and false positive  results. It is most reliable when a satisfactory sample, regularly  obtained, is submitted with relevant clinical findings and history,  and when the Pap result is evaluated along with historic and current  clinical information.       PHQ2/9: Depression screen [facility] 2/9 07/08/2016 12/05/2015  Decreased Interest 0 0  Down, Depressed, Hopeless 0 0  PHQ - 2 Score 0 0    Fall Risk: Fall Risk  07/08/2016 12/05/2015  Falls in the past year? No No     Assessment & Plan  1. Perennial allergic rhinitis with seasonal variation  - Spirometry: Peak - montelukast (SINGULAIR) 10 MG tablet; Take 1 tablet (10 mg total) by mouth at  bedtime.  Dispense: 90 tablet; Refill: 1 - loratadine (CLARITIN) 10 MG tablet; Take 1 tablet (10 mg total) by mouth daily.  Dispense: 30 tablet; Refill: 0 - fluticasone (FLONASE) 50 MCG/ACT nasal spray; Place 2 sprays into both nostrils daily.  Dispense: 16 g; Refill: 2  2. Cough  - Spirometry: Peak - albuterol (PROAIR HFA) 108 (90 Base) MCG/ACT inhaler; Inhale 2 puffs into the lungs every 6 (six) hours as needed for wheezing or shortness of breath.  Dispense: 1 Inhaler; Refill: 0  3. Mild intermittent asthma with acute exacerbation  - montelukast (SINGULAIR) 10 MG tablet; Take 1 tablet (10 mg total) by mouth at bedtime.  Dispense: 90 tablet; Refill: 1 - albuterol (PROAIR HFA) 108 (90 Base) MCG/ACT inhaler; Inhale 2 puffs into the lungs every 6 (six) hours as needed for wheezing or shortness of breath.  Dispense: 1 Inhaler; Refill: 0  4. Vitamin D deficiency  Taking supplementation   5. Primary dysmenorrhea  On Depo   6. Low serum vitamin B12  - cyanocobalamin ((VITAMIN B-12)) injection 1,000 mcg; Inject 1 mL (1,000 mcg total) into the muscle once.  7. Regurgitation  - ranitidine (ZANTAC) 150 MG tablet; Take 1 tablet (150 mg total) by mouth 2 (two) times daily.  Dispense: 60 tablet; Refill: 2  8. Gastroesophageal reflux disease without esophagitis  - ranitidine (ZANTAC) 150 MG tablet; Take 1 tablet (150 mg total) by mouth 2 (two) times daily.  Dispense: 60 tablet; Refill: 2

## 2016-10-20 ENCOUNTER — Ambulatory Visit (INDEPENDENT_AMBULATORY_CARE_PROVIDER_SITE_OTHER): Payer: 59

## 2016-10-20 DIAGNOSIS — Z3042 Encounter for surveillance of injectable contraceptive: Secondary | ICD-10-CM

## 2016-10-20 MED ORDER — MEDROXYPROGESTERONE ACETATE 150 MG/ML IM SUSP
150.0000 mg | Freq: Once | INTRAMUSCULAR | Status: AC
  Administered 2016-10-20: 150 mg via INTRAMUSCULAR

## 2016-11-09 ENCOUNTER — Ambulatory Visit (INDEPENDENT_AMBULATORY_CARE_PROVIDER_SITE_OTHER): Payer: 59 | Admitting: Family Medicine

## 2016-11-09 ENCOUNTER — Encounter: Payer: Self-pay | Admitting: Family Medicine

## 2016-11-09 VITALS — BP 122/76 | HR 99 | Temp 98.5°F | Resp 16 | Ht 61.0 in | Wt 151.2 lb

## 2016-11-09 DIAGNOSIS — Z91018 Allergy to other foods: Secondary | ICD-10-CM | POA: Diagnosis not present

## 2016-11-09 DIAGNOSIS — R7989 Other specified abnormal findings of blood chemistry: Secondary | ICD-10-CM

## 2016-11-09 DIAGNOSIS — K219 Gastro-esophageal reflux disease without esophagitis: Secondary | ICD-10-CM

## 2016-11-09 DIAGNOSIS — E559 Vitamin D deficiency, unspecified: Secondary | ICD-10-CM

## 2016-11-09 DIAGNOSIS — J3089 Other allergic rhinitis: Secondary | ICD-10-CM

## 2016-11-09 DIAGNOSIS — E538 Deficiency of other specified B group vitamins: Secondary | ICD-10-CM

## 2016-11-09 DIAGNOSIS — N944 Primary dysmenorrhea: Secondary | ICD-10-CM | POA: Diagnosis not present

## 2016-11-09 DIAGNOSIS — J302 Other seasonal allergic rhinitis: Secondary | ICD-10-CM | POA: Diagnosis not present

## 2016-11-09 DIAGNOSIS — J4540 Moderate persistent asthma, uncomplicated: Secondary | ICD-10-CM | POA: Diagnosis not present

## 2016-11-09 MED ORDER — MONTELUKAST SODIUM 10 MG PO TABS: 10.0000 mg | ORAL_TABLET | Freq: Every day | ORAL | 1 refills | Status: DC

## 2016-11-09 MED ORDER — BUDESONIDE-FORMOTEROL FUMARATE 160-4.5 MCG/ACT IN AERO: 2.0000 | INHALATION_SPRAY | Freq: Two times a day (BID) | RESPIRATORY_TRACT | 2 refills | Status: DC

## 2016-11-09 MED ORDER — RANITIDINE HCL 150 MG PO TABS: 150.0000 mg | ORAL_TABLET | Freq: Two times a day (BID) | ORAL | 0 refills | Status: DC | PRN

## 2016-11-09 MED ORDER — VITAMIN D (ERGOCALCIFEROL) 1.25 MG (50000 UNIT) PO CAPS: 50000.0000 [IU] | ORAL_CAPSULE | ORAL | 0 refills | Status: DC

## 2016-11-09 MED ORDER — EPINEPHRINE 0.3 MG/0.3ML IJ SOAJ: 0.3000 mg | Freq: Once | INTRAMUSCULAR | 1 refills | Status: AC

## 2016-11-09 NOTE — Progress Notes (Signed)
Name: Shelley Davis   MRN: [id number]    DOB: [birth year]   Date:11/09/2016       Progress Note  Subjective  Chief Complaint  Chief Complaint  Patient presents with  . Medication Refill    3 month F/U  . Allergic Rhinitis     Has been having watery eyes, needs refill of Epi-Pen  . Gastroesophageal Reflux    Well controlled  . Asthma    Still having symptoms with short of breath    HPI  Regurgitation/GERD:she is doing better since she changed her diet, not over-eating and avoiding spicy food, she is still drinking sweet tea ( Advised her to try to decrease intake), very seldom has regurgitation, heartburn occasional, taking medication prn now, no longer twice daily  Asthma/AR: she has seen an allergist in the past and diagnosed with intermittent asthma, she also has perennial allergic rhinitis that is worse in the Spring. 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 Discussed options, we will start her on Symbicort  Fatigue: she was found to have low Vitamin D and B12 and is getting supplementation now, working two jobs and only sleeps about 5 hours per night. Discussed importance of better sleep schedule   Primary dysmenorrhea: she started Depo in 2015 for heavy and painful cycles. Doing well on medication. She is aware that depo can cause bone loss. Explained importance of high calcium diet and vitamin D supplementation  Food Allergy: she has peanut allergy, and other nuts, she needs a refill of Epipen  Patient Active Problem List   Diagnosis Date Noted  . Asthma, mild intermittent 08/05/2016  . Low serum vitamin B12 08/05/2016  . Primary dysmenorrhea 07/08/2016  . Perennial allergic rhinitis with seasonal variation 12/05/2015  . Allergic to peanuts 12/05/2015  . Vitamin D deficiency 12/05/2015  . Eczema 12/05/2015    Past Surgical History:  Procedure Laterality Date  . WISDOM TOOTH EXTRACTION  Bilateral     Family History  Problem Relation Age of Onset  . Hypertension Mother   . Asthma Sister   . Lung disease Maternal Grandmother     Social History   Social History  . Marital status: Single    Spouse name: N/A  . Number of children: N/A  . 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 Smoking status: Never Smoker  . Smokeless tobacco: Never Used  . Alcohol use 0.6 oz/week    1 Shots of liquor per week     Comment: mixed drink occasionally  . Drug use: No  . Sexual activity: Yes    Partners: Male    Birth control/ protection: Injection   Other Topics Concern  . Not on file   Social History Narrative   She works two jobs, living with sister as room-mates   Dating Shelley Davis since end of 2016     Current Outpatient Prescriptions:  .  albuterol (PROAIR HFA) 108 (90 Base) MCG/ACT inhaler, Inhale 2 puffs into the lungs every 6 (six) hours as needed for wheezing or shortness of breath., Disp: 1 Inhaler, Rfl: 0 .  EPINEPHrine (EPIPEN 2-PAK) 0.3 mg/0.3 mL IJ SOAJ injection, Inject 0.3 mLs (0.3 mg total) into the muscle once., Disp: 1 Device, Rfl: 1 .  ketoconazole (NIZORAL) 2 % shampoo, SHAMPOO SCALP AND  FACE (LEAVE ON FOR FIVE MINUTES, THEN RINSE) 2 -3 X WEEKLY, Disp: , Rfl: 10 .  loratadine (CLARITIN) 10 MG tablet, Take 1 tablet (10 mg total) by mouth daily., Disp: 30 tablet, Rfl: 0 .  MedroxyPROGESTERone Acetate 150 MG/ML SUSY, Inject 1 mL (150 mg total) into the muscle every 3 (three) months., Disp: 1 mL, Rfl: 4 .  montelukast (SINGULAIR) 10 MG tablet, Take 1 tablet (10 mg total) by mouth at bedtime., Disp: 90 tablet, Rfl: 1 .  ranitidine (ZANTAC) 150 MG tablet, Take 1 tablet (150 mg total) by mouth 2 (two) times daily as needed for heartburn., Disp: 90 tablet, Rfl: 0 .  Vitamin D, Ergocalciferol, (DRISDOL) 50000 units CAPS capsule, Take 1 capsule (50,000 Units total) by mouth every 7  (seven) days., Disp: 12 capsule, Rfl: 0 .  budesonide-formoterol (SYMBICORT) 160-4.5 MCG/ACT inhaler, Inhale 2 puffs into the lungs 2 (two) times daily., Disp: 1 Inhaler, Rfl: 2 .  fluticasone (FLONASE) 50 MCG/ACT nasal spray, Place 2 sprays into both nostrils daily. (Patient not taking: Reported on 11/09/2016), Disp: 16 g, Rfl: 2  Allergies  Allergen Reactions  . Peanut Oil      ROS  Constitutional: Negative for fever or weight change.  Respiratory: Negative for cough and shortness of breath.   Cardiovascular: Negative for chest pain or palpitations.  Gastrointestinal: Negative for abdominal pain, no bowel changes.  Musculoskeletal: Negative for gait problem or joint swelling.  Skin: Negative for rash.  Neurological: Negative for dizziness or headache.  No other specific complaints in a complete review of systems (except as listed in HPI above).  Objective  Vitals:   11/09/16 0928  BP: 122/76  Pulse: 99  Resp: 16  Temp: 98.5 F (36.9 C)  TempSrc: Oral  SpO2: 98%  Weight: 151 lb 3.2 oz (68.6 kg)  Height: 5\' 1"  (1.549 m)    Body mass index is 28.57 kg/m.  Physical Exam  Constitutional: Patient appears well-developed and well-nourished. Overweight. No distress.  HEENT: head atraumatic, normocephalic, pupils equal and reactive to light, neck supple, throat within normal limits Cardiovascular: Normal rate, regular rhythm and normal heart sounds.  No murmur heard. No BLE edema. Pulmonary/Chest: Effort normal and breath sounds normal. No respiratory distress. Abdominal: Soft.  There is no tenderness. Psychiatric: Patient has a normal mood and affect. behavior is normal. Judgment and thought content normal.  PHQ2/9: Depression screen [facility] 2/9 11/09/2016 07/08/2016 12/05/2015  Decreased Interest 0 0 0  Down, Depressed, Hopeless 0 0 0  PHQ - 2 Score 0 0 0     Fall Risk: Fall Risk  11/09/2016 07/08/2016 12/05/2015  Falls in the past year? No No No    Functional Status  Survey: Is the patient deaf or have difficulty hearing?: No Does the patient have difficulty seeing, even when wearing glasses/contacts?: No Does the patient have difficulty concentrating, remembering, or making decisions?: No Does the patient have difficulty walking or climbing stairs?: No Does the patient have difficulty dressing or bathing?: No Does the patient have difficulty doing errands alone such as visiting a doctor's office or shopping?: No   Assessment & Plan  1. Perennial allergic rhinitis with seasonal variation  - montelukast (SINGULAIR) 10 MG tablet; Take 1 tablet (10 mg total) by mouth at bedtime.  Dispense: 90 tablet; Refill: 1  2. Asthma, moderate persistent, poorly-controlled  We will add Symbicort - montelukast (SINGULAIR) 10 MG tablet; Take 1 tablet (10 mg total) by mouth at bedtime.  Dispense: 90 tablet; Refill:  1 - budesonide-formoterol (SYMBICORT) 160-4.5 MCG/ACT inhaler; Inhale 2 puffs into the lungs 2 (two) times daily.  Dispense: 1 Inhaler; Refill: 2  3. Primary dysmenorrhea  Doing well on ocp  4. Low serum vitamin B12  Continue B12 supplementation   5. Vitamin D deficiency  - Vitamin D, Ergocalciferol, (DRISDOL) 50000 units CAPS capsule; Take 1 capsule (50,000 Units total) by mouth every 7 (seven) days.  Dispense: 12 capsule; Refill: 0  6. Gastroesophageal reflux disease without esophagitis  Doing better, taking medication prn - ranitidine (ZANTAC) 150 MG tablet; Take 1 tablet (150 mg total) by mouth 2 (two) times daily as needed for heartburn.  Dispense: 90 tablet; Refill: 0  7. Food allergy  - EPINEPHrine (EPIPEN 2-PAK) 0.3 mg/0.3 mL IJ SOAJ injection; Inject 0.3 mLs (0.3 mg total) into the muscle once.  Dispense: 1 Device; Refill: 1

## 2016-11-25 ENCOUNTER — Emergency Department: Payer: 59

## 2016-11-25 ENCOUNTER — Emergency Department
Admission: EM | Admit: 2016-11-25 | Discharge: 2016-11-25 | Disposition: A | Discharge status: Home / Self Care | Payer: 59 | Attending: Emergency Medicine | Admitting: Emergency Medicine

## 2016-11-25 DIAGNOSIS — R1030 Lower abdominal pain, unspecified: Secondary | ICD-10-CM | POA: Diagnosis present

## 2016-11-25 DIAGNOSIS — J45909 Unspecified asthma, uncomplicated: Secondary | ICD-10-CM | POA: Diagnosis not present

## 2016-11-25 DIAGNOSIS — Z91010 Allergy to peanuts: Secondary | ICD-10-CM | POA: Insufficient documentation

## 2016-11-25 DIAGNOSIS — R102 Pelvic and perineal pain: Secondary | ICD-10-CM

## 2016-11-25 DIAGNOSIS — Z79899 Other long term (current) drug therapy: Secondary | ICD-10-CM | POA: Insufficient documentation

## 2016-11-25 LAB — URINALYSIS, COMPLETE (UACMP) WITH MICROSCOPIC
Bilirubin Urine: NEGATIVE
GLUCOSE, UA: NEGATIVE mg/dL
HGB URINE DIPSTICK: NEGATIVE
Ketones, ur: NEGATIVE mg/dL
Leukocytes, UA: NEGATIVE
NITRITE: NEGATIVE
PROTEIN: NEGATIVE mg/dL
RBC / HPF: NONE SEEN RBC/hpf (ref 0–5)
SPECIFIC GRAVITY, URINE: 1.026 (ref 1.005–1.030)
pH: 5 (ref 5.0–8.0)

## 2016-11-25 LAB — WET PREP, GENITAL
Clue Cells Wet Prep HPF POC: NONE SEEN
SPERM: NONE SEEN
TRICH WET PREP: NONE SEEN
YEAST WET PREP: NONE SEEN

## 2016-11-25 LAB — COMPREHENSIVE METABOLIC PANEL
ALBUMIN: 4.4 g/dL (ref 3.5–5.0)
ALK PHOS: 60 U/L (ref 38–126)
ALT: 10 U/L — AB (ref 14–54)
ANION GAP: 8 (ref 5–15)
AST: 17 U/L (ref 15–41)
BILIRUBIN TOTAL: 0.7 mg/dL (ref 0.3–1.2)
BUN: 13 mg/dL (ref 6–20)
CALCIUM: 9.5 mg/dL (ref 8.9–10.3)
CO2: 26 mmol/L (ref 22–32)
CREATININE: 0.82 mg/dL (ref 0.44–1.00)
Chloride: 106 mmol/L (ref 101–111)
GFR calc Af Amer: >60 mL/min (ref >60)
GFR calc non Af Amer: >60 mL/min (ref >60)
GLUCOSE: 101 mg/dL — AB (ref 65–99)
Potassium: 3.9 mmol/L (ref 3.5–5.1)
SODIUM: 140 mmol/L (ref 135–145)
TOTAL PROTEIN: 7.8 g/dL (ref 6.5–8.1)

## 2016-11-25 LAB — CBC
HCT: 38.1 % (ref 35.0–47.0)
Hemoglobin: 12.8 g/dL (ref 12.0–16.0)
MCH: 25.9 pg — AB (ref 26.0–34.0)
MCHC: 33.6 g/dL (ref 32.0–36.0)
MCV: 76.9 fL — ABNORMAL LOW (ref 80.0–100.0)
Platelets: 313 10*3/uL (ref 150–440)
RBC: 4.95 MIL/uL (ref 3.80–5.20)
RDW: 14.6 % — AB (ref 11.5–14.5)
WBC: 6.5 10*3/uL (ref 3.6–11.0)

## 2016-11-25 LAB — CHLAMYDIA/NGC RT PCR (ARMC ONLY)
Chlamydia Tr: NOT DETECTED
N GONORRHOEAE: NOT DETECTED

## 2016-11-25 LAB — LIPASE, BLOOD: Lipase: 43 U/L (ref 11–51)

## 2016-11-25 LAB — POCT PREGNANCY, URINE: Preg Test, Ur: NEGATIVE

## 2016-11-25 MED ORDER — IBUPROFEN 600 MG PO TABS
600.0000 mg | ORAL_TABLET | ORAL | Status: AC
  Administered 2016-11-25: 600 mg via ORAL
  Filled 2016-11-25: qty 1

## 2016-11-25 NOTE — Discharge Instructions (Signed)

## 2016-11-25 NOTE — ED Notes (Signed)
Pt still unable to void - given 2nd cup of sprite

## 2016-11-25 NOTE — ED Notes (Signed)
Pt unable to urinate at this moment, given specimen cup for when is able to void. Pt walked back to rm 7.

## 2016-11-25 NOTE — ED Notes (Signed)
Pt given cup of sprite.  

## 2016-11-25 NOTE — ED Notes (Signed)
Patient transported to Ultrasound 

## 2016-11-25 NOTE — ED Provider Notes (Signed)
[facility] Emergency Department Provider Note   ____________________________________________   First MD Initiated Contact with Patient 11/25/16 1507     (approximate)  I have reviewed the triage vital signs and the nursing notes.   HISTORY  Chief Complaint Abdominal Pain    HPI Shelley Davis is a 21 y.o. female reports for about 2 weeks been having an aching discomfort in the lower pelvis, slightly worse on the left than on the right. For since somewhat sharp or achy feeling. A pulled muscle but deeper internally. Denies any nausea vomiting or fevers. Denies significant pain on the right lower abdomen. No pain in the upper abdomen. No vomiting. No diarrhea.  Reports mild discomfort at this time, primarily in the left lower pelvic/groin region.  Never been pregnant. Denies pregnancy today, reports she is occasionally sexually active. Reports symptoms seemed to start after a trip to [facility] couple weeks ago   Past Medical History:  Diagnosis Date  . Allergy   . Eczema     Patient Active Problem List   Diagnosis Date Noted  . Asthma, mild intermittent 08/05/2016  . Low serum vitamin B12 08/05/2016  . Primary dysmenorrhea 07/08/2016  . Perennial allergic rhinitis with seasonal variation 12/05/2015  . Allergic to peanuts 12/05/2015  . Vitamin D deficiency 12/05/2015  . Eczema 12/05/2015    Past Surgical History:  Procedure Laterality Date  . WISDOM TOOTH EXTRACTION Bilateral     Prior to Admission medications   Medication Sig Start Date End Date Taking? Authorizing Provider  albuterol (PROAIR HFA) 108 (90 Base) MCG/ACT inhaler Inhale 2 puffs into the lungs every 6 (six) hours as needed for wheezing or shortness of breath. 08/05/16  Yes Sowles, Drue Stager, MD  budesonide-formoterol [facility]) 160-4.5 MCG/ACT inhaler Inhale 2 puffs into the lungs 2 (two) times daily. 11/09/16  Yes Sowles, Drue Stager, MD  EPIPEN 2-PAK 0.3 MG/0.3ML SOAJ injection  Inject 0.3 mLs into the skin once as needed for anaphylaxis. 11/09/16  Yes [provider]  ketoconazole (NIZORAL) 2 % shampoo SHAMPOO SCALP AND FACE (LEAVE ON FOR FIVE MINUTES, THEN RINSE) 2 -3 X WEEKLY 10/26/16  Yes [provider]  loratadine (CLARITIN) 10 MG tablet Take 1 tablet (10 mg total) by mouth daily. 08/05/16  Yes Sowles, Drue Stager, MD  MedroxyPROGESTERone Acetate 150 MG/ML SUSY Inject 1 mL (150 mg total) into the muscle every 3 (three) months. 07/08/16  Yes Sowles, Drue Stager, MD  montelukast (SINGULAIR) 10 MG tablet Take 1 tablet (10 mg total) by mouth at bedtime. 11/09/16  Yes Sowles, Drue Stager, MD  ranitidine (ZANTAC) 150 MG tablet Take 1 tablet (150 mg total) by mouth 2 (two) times daily as needed for heartburn. 11/09/16  Yes Sowles, Drue Stager, MD  Vitamin D, Ergocalciferol, (DRISDOL) 50000 units CAPS capsule Take 1 capsule (50,000 Units total) by mouth every 7 (seven) days. 11/09/16  Yes Sowles, Drue Stager, MD  fluticasone (FLONASE) 50 MCG/ACT nasal spray Place 2 sprays into both nostrils daily. Patient not taking: Reported on 11/09/2016 08/05/16   Steele Sizer, MD    Allergies Peanut oil  Family History  Problem Relation Age of Onset  . Hypertension Mother   . Asthma Sister   . Lung disease Maternal Grandmother     Social History Social History  Substance Use Topics  . Smoking status: Never Smoker  . Smokeless tobacco: Never Used  . Alcohol use No    Review of Systems Constitutional: No fever/chills Eyes: No visual changes. ENT: No sore throat. Cardiovascular:  Denies chest pain. Respiratory: Denies shortness of breath. Gastrointestinal:   No nausea, no vomiting.  No diarrhea.  No constipation. Genitourinary: Negative for dysuria. Musculoskeletal: Negative for back pain. Skin: Negative for rash. Neurological: Negative for headaches, focal weakness or numbness.    ____________________________________________   PHYSICAL EXAM:  VITAL SIGNS: ED Triage  Vitals  Enc Vitals Group     BP 11/25/16 1459 131/77     Pulse Rate 11/25/16 1459 (!) 113     Resp 11/25/16 1459 20     Temp 11/25/16 1459 98.3 F (36.8 C)     Temp Source 11/25/16 1459 Oral     SpO2 11/25/16 1459 95 %     Weight 11/25/16 1459 150 lb (68 kg)     Height 11/25/16 1459 5' (1.524 m)     Head Circumference --      Peak Flow --      Pain Score 11/25/16 1458 8     Pain Loc --      Pain Edu? --      Excl. in [place]? --     Constitutional: Alert and oriented. Well appearing and in no acute distress. Eyes: Conjunctivae are normal. Head: Atraumatic. Nose: No congestion/rhinnorhea. Mouth/Throat: Mucous membranes are moist. Neck: No stridor.   Cardiovascular: Normal rate, regular rhythm. Grossly normal heart sounds.  Good peripheral circulation. Respiratory: Normal respiratory effort.  No retractions. Lungs CTAB. Gastrointestinal: Soft and non tender except for minimal discomfort in the suprapubic region. No distention. No focal pain at McBurney's point. Denies pain over the right and right lower abdomen. Pelvic exam performed with nurse Helene Kelp. Normal external exam. Internal exam demonstrates minimal discomfort to speculum exam, but a slight whitish discharge is noted. No purulent drainage. No cervical motion tenderness. No adnexal masses palpable. Groin also examined, no masses or rashes. No hernias noted. Musculoskeletal: No lower extremity tenderness nor edema. Walks with normal gait. Denies pain in the hip joints. Neurologic:  Normal speech and language. No gross focal neurologic deficits are appreciated.  Skin:  Skin is warm, dry and intact. No rash noted. Psychiatric: Mood and affect are normal. Speech and behavior are normal.  ____________________________________________   LABS (all labs ordered are listed, but only abnormal results are displayed)  Labs Reviewed  WET PREP, GENITAL - Abnormal; Notable for the following:       Result Value   WBC, Wet Prep HPF POC FEW  (*)    All other components within normal limits  COMPREHENSIVE METABOLIC PANEL - Abnormal; Notable for the following:    Glucose, Bld 101 (*)    ALT 10 (*)    All other components within normal limits  CBC - Abnormal; Notable for the following:    MCV 76.9 (*)    MCH 25.9 (*)    RDW 14.6 (*)    All other components within normal limits  URINALYSIS, COMPLETE (UACMP) WITH MICROSCOPIC - Abnormal; Notable for the following:    Color, Urine YELLOW (*)    APPearance CLEAR (*)    Bacteria, UA RARE (*)    Squamous Epithelial / LPF 0-5 (*)    All other components within normal limits  CHLAMYDIA/NGC RT PCR ([facility] ONLY)  LIPASE, BLOOD  POC URINE PREG, ED  POCT PREGNANCY, URINE   ____________________________________________  EKG   ____________________________________________  RADIOLOGY  US Transvaginal Non-ob  Result Date: 11/25/2016 CLINICAL DATA:  Two week history pelvic pain. EXAM: TRANSABDOMINAL AND TRANSVAGINAL ULTRASOUND OF PELVIS DOPPLER ULTRASOUND OF OVARIES TECHNIQUE: Both  transabdominal and transvaginal ultrasound examinations of the pelvis were performed. Transabdominal technique was performed for global imaging of the pelvis including uterus, ovaries, adnexal regions, and pelvic cul-de-sac. It was necessary to proceed with endovaginal exam following the transabdominal exam to visualize the ovaries. Color and duplex Doppler ultrasound was utilized to evaluate blood flow to the ovaries. COMPARISON:  None. FINDINGS: Uterus Measurements: 6.1 x 2.8 x 4.4 cm. No fibroids or other mass visualized. Endometrium Thickness: 1-2 mm.  Trace fluid noted in the endometrial canal. Right ovary Measurements: 2.4 x 1.9 x 1.6 cm. Normal appearance/no adnexal mass. Left ovary Measurements: 3.5 x 1.5 x 1.9 cm. Normal appearance/no adnexal mass. Pulsed Doppler evaluation of both ovaries demonstrates normal low-resistance arterial and venous waveforms. Other findings No abnormal free fluid.  IMPRESSION: Unremarkable pelvic ultrasound exam. Electronically Signed   By: Misty Stanley M.D.   On: 11/25/2016 17:02   US Pelvis Complete  Result Date: 11/25/2016 CLINICAL DATA:  Two week history pelvic pain. EXAM: TRANSABDOMINAL AND TRANSVAGINAL ULTRASOUND OF PELVIS DOPPLER ULTRASOUND OF OVARIES TECHNIQUE: Both transabdominal and transvaginal ultrasound examinations of the pelvis were performed. Transabdominal technique was performed for global imaging of the pelvis including uterus, ovaries, adnexal regions, and pelvic cul-de-sac. It was necessary to proceed with endovaginal exam following the transabdominal exam to visualize the ovaries. Color and duplex Doppler ultrasound was utilized to evaluate blood flow to the ovaries. COMPARISON:  None. FINDINGS: Uterus Measurements: 6.1 x 2.8 x 4.4 cm. No fibroids or other mass visualized. Endometrium Thickness: 1-2 mm.  Trace fluid noted in the endometrial canal. Right ovary Measurements: 2.4 x 1.9 x 1.6 cm. Normal appearance/no adnexal mass. Left ovary Measurements: 3.5 x 1.5 x 1.9 cm. Normal appearance/no adnexal mass. Pulsed Doppler evaluation of both ovaries demonstrates normal low-resistance arterial and venous waveforms. Other findings No abnormal free fluid. IMPRESSION: Unremarkable pelvic ultrasound exam. Electronically Signed   By: Misty Stanley M.D.   On: 11/25/2016 17:02   [place] Art/ven Flow Abd Pelv Doppler  Result Date: 11/25/2016 CLINICAL DATA:  Two week history pelvic pain. EXAM: TRANSABDOMINAL AND TRANSVAGINAL ULTRASOUND OF PELVIS DOPPLER ULTRASOUND OF OVARIES TECHNIQUE: Both transabdominal and transvaginal ultrasound examinations of the pelvis were performed. Transabdominal technique was performed for global imaging of the pelvis including uterus, ovaries, adnexal regions, and pelvic cul-de-sac. It was necessary to proceed with endovaginal exam following the transabdominal exam to visualize the ovaries. Color and duplex Doppler ultrasound was  utilized to evaluate blood flow to the ovaries. COMPARISON:  None. FINDINGS: Uterus Measurements: 6.1 x 2.8 x 4.4 cm. No fibroids or other mass visualized. Endometrium Thickness: 1-2 mm.  Trace fluid noted in the endometrial canal. Right ovary Measurements: 2.4 x 1.9 x 1.6 cm. Normal appearance/no adnexal mass. Left ovary Measurements: 3.5 x 1.5 x 1.9 cm. Normal appearance/no adnexal mass. Pulsed Doppler evaluation of both ovaries demonstrates normal low-resistance arterial and venous waveforms. Other findings No abnormal free fluid. IMPRESSION: Unremarkable pelvic ultrasound exam. Electronically Signed   By: Misty Stanley M.D.   On: 11/25/2016 17:02    ____________________________________________   PROCEDURES  Procedure(s) performed: None  Procedures  Critical Care performed: No  ____________________________________________   INITIAL IMPRESSION / ASSESSMENT AND PLAN / ED COURSE  Pertinent labs & imaging results that were available during my care of the patient were reviewed by me and considered in my medical decision making (see chart for details).  Differential diagnosis includes but is not limited to, abdominal perforation, aortic dissection, cholecystitis, appendicitis, diverticulitis, colitis, esophagitis/gastritis,  kidney stone, pyelonephritis, urinary tract infection, etc. All are considered in decision and treatment plan. Based upon the patient's presentation and risk factors, the most suspect likely some type of discomfort related pelvic etiology. Pelvic exam does show slight white discharge and does report some discomfort, seems possible she has some type of vaginosis his primary. No signs or symptoms suggest an acute abdomen, afebrile, normal white count, does not appear consistent with acute appendicitis or other acute intra-abdominal process. We'll obtain ultrasound to evaluate for any signs of ovarian process, feel less likely torsion.  UPT  Negative  ----------------------------------------- 5:52 PM on 11/25/2016 -----------------------------------------  Resting comfortably. Reviewed results with her, as noted still somewhat unclear as to the cause of her discomfort. Does not have an acute abdomen, no fever no white count and she appears stable. Discussed with her CT scan to further evaluate, but after shared medical decision making and review of risks and benefits patient wishes to be discharged and will follow-up closely with her primary with careful return precautions. I think is very reasonable.  I discussed with the patient the risks and benefits of abdominal CT scan. The present time there is no clear indication that the patient requires CT, the patient does have an abdominal complaint but exam does not suggest acute surgical abdomen and my suspicion for intra-abdominal infection including appendicitis, cholecystitis, aaa, dissection, ischemia, perforation, pancreatitis, diverticulitis or other acute major intra-abdominal process is quite low. After discussing the risks and benefits including benefits of additional evaluation for diagnoses, ruling out infection/perforation/aaa/etc, but also discussing the risks including low, "well less than 1%," but not 0 risk of inducing cancers due to radiation and potential risks of contrast the patient indicated via our shared medical decision-making that she would not do a CAT scan. Rather if the patient does have worsening symptoms, develops a high fever, develops pain or persistent discomfort in the right upper quadrant or right lower quadrant, or other new concerns arise they will come back to emergency room right away. As the patient's clinician I think this is a very reasonable decision having discussed general risks and benefits of CT, and my clinical suspicion that CT would be of benefit at this time is very low.  Return precautions and treatment recommendations and follow-up discussed  with the patient who is agreeable with the plan.        ____________________________________________   FINAL CLINICAL IMPRESSION(S) / ED DIAGNOSES  Final diagnoses:  Pelvic pain in female      NEW MEDICATIONS STARTED DURING THIS VISIT:  New Prescriptions   No medications on file     Note:  This document was prepared using Dragon voice recognition software and may include unintentional dictation errors.     Delman Kitten, MD 11/25/16 1754

## 2016-11-25 NOTE — ED Triage Notes (Signed)
Pt reports that for the past 2 weeks she has had pain in her lower abd on bilat sides - last night the pain got worse on the left lower side of her abd - pt denies nausea and vomiting - denies any pain/difficulty/frequency with urination

## 2016-12-21 ENCOUNTER — Other Ambulatory Visit: Payer: Self-pay | Admitting: Family Medicine

## 2016-12-21 DIAGNOSIS — K219 Gastro-esophageal reflux disease without esophagitis: Secondary | ICD-10-CM

## 2016-12-22 ENCOUNTER — Ambulatory Visit: Payer: 59 | Admitting: Family Medicine

## 2017-01-14 ENCOUNTER — Ambulatory Visit (INDEPENDENT_AMBULATORY_CARE_PROVIDER_SITE_OTHER): Payer: 59 | Admitting: Family Medicine

## 2017-01-14 ENCOUNTER — Encounter: Payer: Self-pay | Admitting: Family Medicine

## 2017-01-14 VITALS — BP 126/78 | HR 102 | Temp 98.3°F | Resp 16 | Ht 60.0 in | Wt 152.6 lb

## 2017-01-14 DIAGNOSIS — M791 Myalgia, unspecified site: Secondary | ICD-10-CM

## 2017-01-14 DIAGNOSIS — M542 Cervicalgia: Secondary | ICD-10-CM | POA: Diagnosis not present

## 2017-01-14 LAB — CBC WITH DIFFERENTIAL/PLATELET
Basophils Absolute: 0 cells/uL (ref 0–200)
Basophils Relative: 0 %
EOS PCT: 4 %
Eosinophils Absolute: 268 cells/uL (ref 15–500)
HEMATOCRIT: 38.5 % (ref 35.0–45.0)
Hemoglobin: 12.7 g/dL (ref 11.7–15.5)
LYMPHS PCT: 40 %
Lymphs Abs: 2680 cells/uL (ref 850–3900)
MCH: 25.9 pg — ABNORMAL LOW (ref 27.0–33.0)
MCHC: 33 g/dL (ref 32.0–36.0)
MCV: 78.6 fL — ABNORMAL LOW (ref 80.0–100.0)
MONOS PCT: 9 %
MPV: 8.3 fL (ref 7.5–12.5)
Monocytes Absolute: 603 cells/uL (ref 200–950)
NEUTROS PCT: 47 %
Neutro Abs: 3149 cells/uL (ref 1500–7800)
Platelets: 334 10*3/uL (ref 140–400)
RBC: 4.9 MIL/uL (ref 3.80–5.10)
RDW: 14.3 % (ref 11.0–15.0)
WBC: 6.7 10*3/uL (ref 3.8–10.8)

## 2017-01-14 LAB — COMPLETE METABOLIC PANEL WITH GFR
ALT: 7 U/L (ref 6–29)
AST: 12 U/L (ref 10–30)
Albumin: 4.5 g/dL (ref 3.6–5.1)
Alkaline Phosphatase: 68 U/L (ref 33–115)
BUN: 14 mg/dL (ref 7–25)
CALCIUM: 9.6 mg/dL (ref 8.6–10.2)
CHLORIDE: 105 mmol/L (ref 98–110)
CO2: 26 mmol/L (ref 20–32)
Creat: 0.74 mg/dL (ref 0.50–1.10)
GFR, Est African American: >89 mL/min (ref >=60)
GFR, Est Non African American: >89 mL/min (ref >=60)
Glucose, Bld: 86 mg/dL (ref 65–99)
POTASSIUM: 4.3 mmol/L (ref 3.5–5.3)
Sodium: 139 mmol/L (ref 135–146)
Total Bilirubin: 0.5 mg/dL (ref 0.2–1.2)
Total Protein: 7 g/dL (ref 6.1–8.1)

## 2017-01-14 LAB — CK: CK TOTAL: 81 U/L (ref 29–143)

## 2017-01-14 MED ORDER — BACLOFEN 10 MG PO TABS: 10.0000 mg | ORAL_TABLET | Freq: Every evening | ORAL | 0 refills | Status: DC | PRN

## 2017-01-14 NOTE — Progress Notes (Signed)
Name: Shelley Davis   MRN: [id number]    DOB: 17-Jan-1996   Date:01/14/2017       Progress Note  Subjective  Chief Complaint  Chief Complaint  Patient presents with  . Generalized Body Aches    Hurts all over for the past couple of months, used to take allergies shots. But since her insurance changed unable to afford them. Aches all over. Hurts worst when she is moving around alot when working. Patient states she takes Tylenol for pain relief.   . Neck Pain    Right Neck Pain    HPI  Myalgia: she has been working for [employer] for many years ( currently called [employer]) however over the past few months she has noticed her body aches during and after work, sometimes wakes up with body feeling sore. It is not her joints but the large muscle groups. She states she stays hydrated at work. Symptoms were not present while on a cruise for one week. She states symptoms seems to improve if she sleep more than 9 hours. No redness, no change in urine. No nausea, vomiting, fever or change in appetite.   Neck pain: she states that she developed acute pain on right side of the neck while at work last night, she states pain has improved since. No radiation, denies sore throat, fever or chills.    Patient Active Problem List   Diagnosis Date Noted  . Asthma, mild intermittent 08/05/2016  . Low serum vitamin B12 08/05/2016  . Primary dysmenorrhea 07/08/2016  . Perennial allergic rhinitis with seasonal variation 12/05/2015  . Allergic to peanuts 12/05/2015  . Vitamin D deficiency 12/05/2015  . Eczema 12/05/2015    Past Surgical History:  Procedure Laterality Date  . WISDOM TOOTH EXTRACTION Bilateral     Family History  Problem Relation Age of Onset  . Hypertension Mother   . Asthma Sister   . Lung disease Maternal Grandmother     Social History   Social History  . Marital status: Single    Spouse name: N/A  . Number of children: N/A  . Years of education: N/A   Occupational History  . sales [employer]  . assembly line  [employer]   Social History Main Topics  . Smoking status: Never Smoker  . Smokeless tobacco: Never Used  . Alcohol use No  . Drug use: No  . Sexual activity: Yes    Partners: Male    Birth control/ protection: Injection   Other Topics Concern  . 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  albuterol (PROAIR HFA) 108 (90 Base) MCG/ACT inhaler, Inhale 2 puffs into the lungs every 6 (six) hours as needed for wheezing or shortness of breath., Disp: 1 Inhaler, Rfl: 0 .  budesonide-formoterol (SYMBICORT) 160-4.5 MCG/ACT inhaler, Inhale 2 puffs into the lungs 2 (two) times daily., Disp: 1 Inhaler, Rfl: 2 .  EPIPEN 2-PAK 0.3 MG/0.3ML SOAJ injection, Inject 0.3 mLs into the skin once as needed for anaphylaxis., Disp: , Rfl: 1 .  ketoconazole (NIZORAL) 2 % shampoo, SHAMPOO SCALP AND FACE (LEAVE ON FOR FIVE MINUTES, THEN RINSE) 2 -3 X WEEKLY, Disp: , Rfl: 10 .  MedroxyPROGESTERone Acetate 150 MG/ML SUSY, Inject 1 mL (150 mg total) into the muscle every 3 (three) months., Disp: 1 mL, Rfl: 4 .  ranitidine (ZANTAC) 150 MG tablet, TAKE 1 TABLET (150 MG TOTAL) BY MOUTH 2 (TWO) TIMES DAILY AS NEEDED FOR HEARTBURN., Disp: 90 tablet, Rfl: 0 .  Vitamin D, Ergocalciferol, (DRISDOL) 50000 units CAPS capsule, Take 1 capsule (50,000 Units total) by mouth every 7 (seven) days., Disp: 12 capsule, Rfl: 0 .  baclofen (LIORESAL) 10 MG tablet, Take 1 tablet (10 mg total) by mouth at bedtime as needed for muscle spasms., Disp: 30 each, Rfl: 0 .  fluticasone (FLONASE) 50 MCG/ACT nasal spray, Place 2 sprays into both nostrils daily. (Patient not taking: Reported on 11/09/2016), Disp: 16 g, Rfl: 2 .  loratadine (CLARITIN) 10 MG tablet, Take 1 tablet (10 mg total) by mouth daily. (Patient not taking: Reported on 01/14/2017), Disp: 30 tablet, Rfl: 0 .   montelukast (SINGULAIR) 10 MG tablet, Take 1 tablet (10 mg total) by mouth at bedtime. (Patient not taking: Reported on 01/14/2017), Disp: 90 tablet, Rfl: 1  Allergies  Allergen Reactions  . Peanut Oil      ROS  Constitutional: Negative for fever or weight change.  Respiratory: Negative for cough and shortness of breath.   Cardiovascular: Negative for chest pain or palpitations.  Gastrointestinal: Negative for abdominal pain, no bowel changes.  Musculoskeletal: Negative for gait problem or joint swelling.  Skin: Negative for rash.  Neurological: Negative for dizziness or headache.  No other specific complaints in a complete review of systems (except as listed in HPI above).  Objective  Vitals:   01/14/17 0817  BP: 126/78  Pulse: (!) 102  Resp: 16  Temp: 98.3 F (36.8 C)  TempSrc: Oral  SpO2: 98%  Weight: 152 lb 9.6 oz (69.2 kg)  Height: 5' (1.524 m)    Body mass index is 29.8 kg/m.  Physical Exam  Constitutional: Patient appears well-developed and well-nourished. Overweight. No distress.  HEENT: head atraumatic, normocephalic, pupils equal and reactive to light,  neck supple, throat within normal limits, pain during palpation of right sternocleidomastoid, right side of face shows larger cheeck than left, however no pain, erythema and cranial nerves are intact ( advised to monitor, could ne normal asymmetry)  Cardiovascular: Normal rate, regular rhythm and normal heart sounds.  No murmur heard. No BLE edema. Pulmonary/Chest: Effort normal and breath sounds normal. No respiratory distress. Abdominal: Soft.  There is no tenderness. Psychiatric: Patient has a normal mood and affect. behavior is normal. Judgment and thought content normal. Muscular Skeletal: normal joints, aching during compression of muscles ( arms and legs)   Recent Results (from the past 2160 hour(s))  Urinalysis, Complete w Microscopic     Status: Abnormal   Collection Time: 11/25/16  2:58 PM  Result  Value Ref Range   Color, Urine YELLOW (A) YELLOW   APPearance CLEAR (A) CLEAR   Specific Gravity, Urine 1.026 1.005 - 1.030   pH 5.0 5.0 - 8.0   Glucose, UA NEGATIVE NEGATIVE mg/dL   Hgb urine dipstick NEGATIVE NEGATIVE   Bilirubin Urine NEGATIVE NEGATIVE   Ketones, ur NEGATIVE NEGATIVE mg/dL   Protein, ur NEGATIVE NEGATIVE mg/dL   Nitrite NEGATIVE NEGATIVE   Leukocytes, UA NEGATIVE NEGATIVE   RBC / HPF NONE SEEN 0 - 5 RBC/hpf   WBC, UA 0-5 0 - 5 WBC/hpf   Bacteria, UA RARE (A) NONE SEEN   Squamous Epithelial / LPF 0-5 (A) NONE SEEN   Mucus PRESENT   Lipase, blood     Status: None   Collection Time: 11/25/16  3:00 PM  Result Value Ref  Range   Lipase 43 11 - 51 U/L  Comprehensive metabolic panel     Status: Abnormal   Collection Time: 11/25/16  3:00 PM  Result Value Ref Range   Sodium 140 135 - 145 mmol/L   Potassium 3.9 3.5 - 5.1 mmol/L   Chloride 106 101 - 111 mmol/L   CO2 26 22 - 32 mmol/L   Glucose, Bld 101 (H) 65 - 99 mg/dL   BUN 13 6 - 20 mg/dL   Creatinine, Ser 0.82 0.44 - 1.00 mg/dL   Calcium 9.5 8.9 - 10.3 mg/dL   Total Protein 7.8 6.5 - 8.1 g/dL   Albumin 4.4 3.5 - 5.0 g/dL   AST 17 15 - 41 U/L   ALT 10 (L) 14 - 54 U/L   Alkaline Phosphatase 60 38 - 126 U/L   Total Bilirubin 0.7 0.3 - 1.2 mg/dL   GFR calc non Af Amer >60 >60 mL/min   GFR calc Af Amer >60 >60 mL/min    Comment: (NOTE) The eGFR has been calculated using the CKD EPI equation. This calculation has not been validated in all clinical situations. eGFR's persistently <60 mL/min signify possible Chronic Kidney Disease.    Anion gap 8 5 - 15  CBC     Status: Abnormal   Collection Time: 11/25/16  3:00 PM  Result Value Ref Range   WBC 6.5 3.6 - 11.0 K/uL   RBC 4.95 3.80 - 5.20 MIL/uL   Hemoglobin 12.8 12.0 - 16.0 g/dL   HCT 38.1 35.0 - 47.0 %   MCV 76.9 (L) 80.0 - 100.0 fL   MCH 25.9 (L) 26.0 - 34.0 pg   MCHC 33.6 32.0 - 36.0 g/dL   RDW 14.6 (H) 11.5 - 14.5 %   Platelets 313 150 - 440 K/uL   Chlamydia/NGC rt PCR     Status: None   Collection Time: 11/25/16  3:32 PM  Result Value Ref Range   Specimen source GC/Chlam ENDOCERVICAL    Chlamydia Tr NOT DETECTED NOT DETECTED   N gonorrhoeae NOT DETECTED NOT DETECTED    Comment: (NOTE) 100  This methodology has not been evaluated in pregnant women or in 200  patients with a history of hysterectomy. 300 400  This methodology will not be performed on patients less than 85  years of age.   Wet prep, genital     Status: Abnormal   Collection Time: 11/25/16  3:32 PM  Result Value Ref Range   Yeast Wet Prep HPF POC NONE SEEN NONE SEEN   Trich, Wet Prep NONE SEEN NONE SEEN   Clue Cells Wet Prep HPF POC NONE SEEN NONE SEEN   WBC, Wet Prep HPF POC FEW (A) NONE SEEN   Sperm NONE SEEN   Pregnancy, urine POC     Status: None   Collection Time: 11/25/16  4:11 PM  Result Value Ref Range   Preg Test, Ur NEGATIVE NEGATIVE    Comment:        THE SENSITIVITY OF THIS METHODOLOGY IS >24 mIU/mL      PHQ2/9: Depression screen [facility] 2/9 01/14/2017 11/09/2016 07/08/2016 12/05/2015  Decreased Interest 0 0 0 0  Down, Depressed, Hopeless 0 0 0 0  PHQ - 2 Score 0 0 0 0     Fall Risk: Fall Risk  01/14/2017 11/09/2016 07/08/2016 12/05/2015  Falls in the past year? No No No No     Functional Status Survey: Is the patient deaf or have difficulty hearing?: No Does  the patient have difficulty seeing, even when wearing glasses/contacts?: No Does the patient have difficulty concentrating, remembering, or making decisions?: No Does the patient have difficulty walking or climbing stairs?: No Does the patient have difficulty dressing or bathing?: No Does the patient have difficulty doing errands alone such as visiting a doctor's office or shopping?: No    Assessment & Plan  1. Myalgia  Likely related to a physical job, try prn Baclofen and Tylenol, avoid NSAID's, use a roll on large muscle groups after work and stay hydrated, however we will check  for etiologies of muscle pain since she has been on the same job for so long and symptoms are new - CBC with Differential/Platelet - COMPLETE METABOLIC PANEL WITH GFR - CK (Creatine Kinase)  2. Neck pain  - baclofen (LIORESAL) 10 MG tablet; Take 1 tablet (10 mg total) by mouth at bedtime as needed for muscle spasms.  Dispense: 30 each; Refill: 0

## 2017-01-20 ENCOUNTER — Ambulatory Visit: Payer: 59

## 2017-01-20 ENCOUNTER — Emergency Department
Admission: EM | Admit: 2017-01-20 | Discharge: 2017-01-20 | Disposition: A | Discharge status: Home / Self Care | Payer: 59 | Attending: Emergency Medicine | Admitting: Emergency Medicine

## 2017-01-20 ENCOUNTER — Encounter: Payer: Self-pay | Admitting: Emergency Medicine

## 2017-01-20 DIAGNOSIS — Z91010 Allergy to peanuts: Secondary | ICD-10-CM | POA: Insufficient documentation

## 2017-01-20 DIAGNOSIS — M79652 Pain in left thigh: Secondary | ICD-10-CM | POA: Insufficient documentation

## 2017-01-20 DIAGNOSIS — R51 Headache: Secondary | ICD-10-CM | POA: Insufficient documentation

## 2017-01-20 DIAGNOSIS — R202 Paresthesia of skin: Secondary | ICD-10-CM | POA: Diagnosis not present

## 2017-01-20 DIAGNOSIS — M79622 Pain in left upper arm: Secondary | ICD-10-CM | POA: Diagnosis not present

## 2017-01-20 DIAGNOSIS — R7982 Elevated C-reactive protein (CRP): Secondary | ICD-10-CM | POA: Diagnosis not present

## 2017-01-20 DIAGNOSIS — J45909 Unspecified asthma, uncomplicated: Secondary | ICD-10-CM | POA: Insufficient documentation

## 2017-01-20 DIAGNOSIS — M791 Myalgia: Secondary | ICD-10-CM | POA: Diagnosis not present

## 2017-01-20 DIAGNOSIS — R519 Headache, unspecified: Secondary | ICD-10-CM

## 2017-01-20 DIAGNOSIS — M79621 Pain in right upper arm: Secondary | ICD-10-CM | POA: Diagnosis not present

## 2017-01-20 NOTE — ED Provider Notes (Signed)
[facility] Emergency Department Provider Note  ____________________________________________  Time seen: Approximately 4:53 PM  I have reviewed the triage vital signs and the nursing notes.   HISTORY  Chief Complaint Tingling    HPI Shelley Davis is a 21 y.o. female presenting with tingling, headache, and thigh pain. The patient reports that since June, she has had daily tingling. Sometimes it is in the left upper extremity, left thigh, right thigh were occasionally in the right upper extremity. She notes that this tingling is often related to headaches, and both the headache and tingling resolved with Tylenol. Occasionally, she also has pain in the left thigh, which she has today with walking that she noticed at work. She denies any visual changes, speech changes, confusion, motor weakness, weight loss or hair loss, fever or chills. There is no history of autoimmune disorders in her family. She has followed with her primary care physician for the symptoms, "they have drawn my blood multiple times and it's always normal; nobody knows was going on." She denies feelings of anxiety during the episodes when she has the tingling.   Past Medical History:  Diagnosis Date  . Allergy   . Eczema     Patient Active Problem List   Diagnosis Date Noted  . Asthma, mild intermittent 08/05/2016  . Low serum vitamin B12 08/05/2016  . Primary dysmenorrhea 07/08/2016  . Perennial allergic rhinitis with seasonal variation 12/05/2015  . Allergic to peanuts 12/05/2015  . Vitamin D deficiency 12/05/2015  . Eczema 12/05/2015    Past Surgical History:  Procedure Laterality Date  . WISDOM TOOTH EXTRACTION Bilateral     Current Outpatient Rx  . Order #: [id number] Class: Normal  . Order #: [id number] Class: Normal  . Order #: [id number] Class: Normal  . Order #: [id number] Class: Historical Med  . Order #: [id number] Class: Normal  . Order #: [id number] Class: Historical Med  . Order  #: [id number] Class: OTC  . Order #: [id number] Class: Normal  . Order #: [id number] Class: Normal  . Order #: [id number] Class: Normal  . Order #: [id number] Class: Normal    Allergies Peanut oil  Family History  Problem Relation Age of Onset  . Hypertension Mother   . Asthma Sister   . Lung disease Maternal Grandmother     Social History Social History  Substance Use Topics  . Smoking status: Never Smoker  . Smokeless tobacco: Never Used  . Alcohol use No    Review of Systems Constitutional: No fever/chills. No lightheadedness or syncope. Eyes: No visual changes. No blurred or double vision. ENT: No sore throat. No congestion or rhinorrhea. Cardiovascular: Denies chest pain. Denies palpitations. Respiratory: Denies shortness of breath.  No cough. Gastrointestinal: No abdominal pain.  No nausea, no vomiting.  No diarrhea.  No constipation. Genitourinary: Negative for dysuria. Musculoskeletal: Negative for back pain. Skin: Negative for rash. Neurological: Positive for headaches. Positive for intermittent tingling of the upper extremities or the bilateral thighs on the anterior portion. No visual changes, speech changes, mental status changes. No difficulty walking.     ____________________________________________   PHYSICAL EXAM:  VITAL SIGNS: ED Triage Vitals  Enc Vitals Group     BP 01/20/17 1630 126/74     Pulse Rate 01/20/17 1630 91     Resp 01/20/17 1630 15     Temp 01/20/17 1630 99.4 F (37.4 C)     Temp src --      SpO2 01/20/17 1630 99 %     Weight 01/20/17 1630 152 lb (68.9 kg)  Height 01/20/17 1630 5' (1.524 m)     Head Circumference --      Peak Flow --      Pain Score 01/20/17 1629 8     Pain Loc --      Pain Edu? --      Excl. in [place]? --     Constitutional: Alert and oriented. Well appearing and in no acute distress. Answers questions appropriately.The patient is tearful on examination Eyes: Conjunctivae are normal.  . EOMI. PERRLA. No horizontal  or vertical nystagmus. No scleral icterus. Head: Atraumatic. Nose: No congestion/rhinnorhea. Mouth/Throat: Mucous membranes are moist.  Neck: No stridor.  Supple.  No midline C-spine tenderness to palpation, step-offs or deformities. Cardiovascular: Normal rate, regular rhythm. No murmurs, rubs or gallops.  Respiratory: Normal respiratory effort.  No accessory muscle use or retractions. Lungs CTAB.  No wheezes, rales or ronchi. Gastrointestinal: Soft, nontender and nondistended.  No guarding or rebound.  No peritoneal signs. Musculoskeletal: No LE edema. No midline thoracic or lumbar spine tenderness to palpation, step-offs or deformity. Neurologic: Alert and oriented 3. Speech is clear. Face and smile symmetric. Tongue is midline. EOMI. PERRLA. No horizontal or vertical nystagmus. No pronator drift. 5 out of 5 grip, biceps, triceps, hip flexors, plantar flexion and dorsiflexion. Normal sensation to light touch in the bilateral upper and lower extremities, and face.  Normal gait without ataxia. Skin:  Skin is warm, dry and intact. No rash noted. Psychiatric: The patient is tearful with a depressed mood and anxious affect. She has good insight. No pressured speech.  ____________________________________________   LABS (all labs ordered are listed, but only abnormal results are displayed)  Labs Reviewed - No data to display ____________________________________________  EKG  Not indicated ____________________________________________  RADIOLOGY  No results found.  ____________________________________________   PROCEDURES  Procedure(s) performed: None  Procedures  Critical Care performed: No ____________________________________________   INITIAL IMPRESSION / ASSESSMENT AND PLAN / ED COURSE  Pertinent labs & imaging results that were available during my care of the patient were reviewed by me and considered in my medical decision making (see chart for details).  21 y.o.  female with intermittent tingling of different extremities associated with headaches and improved with headache resolution for 4 months. Overall, the patient is hemodynamically stable. She has no focal neurologic deficits on examination. Most of the time, her symptoms occur with headache and emesis possibly an atypical migraine presentation. Seizure would be much less likely. Multiple sclerosis or other autoimmune disorders possible, and the patient will require outpatient neurologic follow-up. Consider fibromyalgia. At this time, the patient has had basic labs drawn multiple times and this is not indicated today. I will have her continue to follow with her primary care physician, and I have asked her to keep notebook to record her symptoms that she can bring with her to the neurology outpatient follow-up appointment. Return precautions as well as follow-up instructions were discussed.  ____________________________________________  FINAL CLINICAL IMPRESSION(S) / ED DIAGNOSES  Final diagnoses:  Tingling in extremities  Tingling of face  Left thigh pain  Nonintractable headache, unspecified chronicity pattern, unspecified headache type         NEW MEDICATIONS STARTED DURING THIS VISIT:  New Prescriptions   No medications on file      Eula Listen, MD 01/20/17 1700

## 2017-01-20 NOTE — ED Triage Notes (Signed)
Patient presents to ED via POV from home with c/o numbness and "body pains" since the end of June. Patient ambulatory with a limp. Clear speech, equal and symmetrical hand grasps. Face is symmetrical. Patient has been seen for this before and her work ups have all been unremarkable.

## 2017-01-20 NOTE — Discharge Instructions (Signed)
Please return to the emergency department if you develop severe pain, numbness tingling or weakness, changes in vision or speech, difficulty walking, or any other symptoms concerning to you.

## 2017-01-20 NOTE — ED Notes (Signed)
Family upset at discharge states " Care was terrible we were only here for 30 minutes and nothing was done" RN informed family that MD went over care plan and MD can return to room if they are unhappy with care, family states " No, we are going to leave and go somewhere else, just give [place] the d/c paperwork". Pt in NAD at this time. Pt signed paper copy for d/c.

## 2017-01-20 NOTE — ED Notes (Signed)
Spoke with Dr. Mable Paris in regards to patients presentation. No orders at this time.

## 2017-01-20 NOTE — ED Notes (Signed)
Family at bedside. 

## 2017-01-20 NOTE — ED Notes (Signed)
ED Provider at bedside. 

## 2017-01-22 ENCOUNTER — Ambulatory Visit (INDEPENDENT_AMBULATORY_CARE_PROVIDER_SITE_OTHER): Payer: 59

## 2017-01-22 DIAGNOSIS — Z3042 Encounter for surveillance of injectable contraceptive: Secondary | ICD-10-CM | POA: Diagnosis not present

## 2017-01-22 DIAGNOSIS — N944 Primary dysmenorrhea: Secondary | ICD-10-CM

## 2017-01-22 MED ORDER — MEDROXYPROGESTERONE ACETATE 150 MG/ML IM SUSP
150.0000 mg | Freq: Once | INTRAMUSCULAR | Status: AC
  Administered 2017-01-22: 150 mg via INTRAMUSCULAR

## 2017-01-29 ENCOUNTER — Other Ambulatory Visit: Payer: Self-pay | Admitting: Family Medicine

## 2017-01-29 DIAGNOSIS — E559 Vitamin D deficiency, unspecified: Secondary | ICD-10-CM

## 2017-02-09 ENCOUNTER — Ambulatory Visit: Payer: 59 | Admitting: Family Medicine

## 2017-04-23 ENCOUNTER — Ambulatory Visit: Payer: 59

## 2017-04-23 ENCOUNTER — Ambulatory Visit (INDEPENDENT_AMBULATORY_CARE_PROVIDER_SITE_OTHER): Payer: 59

## 2017-04-23 DIAGNOSIS — M542 Cervicalgia: Secondary | ICD-10-CM

## 2017-04-23 DIAGNOSIS — N944 Primary dysmenorrhea: Secondary | ICD-10-CM | POA: Diagnosis not present

## 2017-04-23 MED ORDER — MEDROXYPROGESTERONE ACETATE 150 MG/ML IM SUSP
150.0000 mg | Freq: Once | INTRAMUSCULAR | Status: AC
  Administered 2017-04-23: 150 mg via INTRAMUSCULAR

## 2017-04-26 MED ORDER — MEDROXYPROGESTERONE ACETATE 150 MG/ML IM SUSP: 150.0000 mg | Freq: Once | INTRAMUSCULAR | Status: DC

## 2017-04-26 NOTE — Addendum Note (Signed)
Addended by: Inda Coke on: 04/26/2017 05:19 PM   Modules accepted: Orders

## 2017-07-12 ENCOUNTER — Encounter: Payer: 59 | Admitting: Family Medicine

## 2017-07-19 ENCOUNTER — Other Ambulatory Visit: Payer: Self-pay | Admitting: Family Medicine

## 2017-07-19 DIAGNOSIS — N944 Primary dysmenorrhea: Secondary | ICD-10-CM

## 2017-07-19 DIAGNOSIS — Z3042 Encounter for surveillance of injectable contraceptive: Secondary | ICD-10-CM

## 2017-07-22 ENCOUNTER — Ambulatory Visit (INDEPENDENT_AMBULATORY_CARE_PROVIDER_SITE_OTHER): Payer: 59

## 2017-07-22 DIAGNOSIS — Z3042 Encounter for surveillance of injectable contraceptive: Secondary | ICD-10-CM | POA: Diagnosis not present

## 2017-07-22 MED ORDER — MEDROXYPROGESTERONE ACETATE 150 MG/ML IM SUSY
150.0000 mg | PREFILLED_SYRINGE | INTRAMUSCULAR | Status: AC
  Administered 2017-07-22: 150 mg via INTRAMUSCULAR

## 2017-07-22 MED ORDER — MEDROXYPROGESTERONE ACETATE 150 MG/ML IM SUSP: 150.0000 mg | Freq: Once | INTRAMUSCULAR | Status: DC

## 2017-09-28 DIAGNOSIS — J3081 Allergic rhinitis due to animal (cat) (dog) hair and dander: Secondary | ICD-10-CM | POA: Diagnosis not present

## 2017-09-28 DIAGNOSIS — J3089 Other allergic rhinitis: Secondary | ICD-10-CM | POA: Diagnosis not present

## 2017-09-28 DIAGNOSIS — J301 Allergic rhinitis due to pollen: Secondary | ICD-10-CM | POA: Diagnosis not present

## 2017-09-28 DIAGNOSIS — J4520 Mild intermittent asthma, uncomplicated: Secondary | ICD-10-CM | POA: Diagnosis not present

## 2017-10-11 DIAGNOSIS — J301 Allergic rhinitis due to pollen: Secondary | ICD-10-CM | POA: Diagnosis not present

## 2017-10-12 DIAGNOSIS — J3081 Allergic rhinitis due to animal (cat) (dog) hair and dander: Secondary | ICD-10-CM | POA: Diagnosis not present

## 2017-10-12 DIAGNOSIS — J3089 Other allergic rhinitis: Secondary | ICD-10-CM | POA: Diagnosis not present

## 2017-10-17 ENCOUNTER — Other Ambulatory Visit: Payer: Self-pay | Admitting: Family Medicine

## 2017-10-17 DIAGNOSIS — Z3042 Encounter for surveillance of injectable contraceptive: Secondary | ICD-10-CM

## 2017-10-17 DIAGNOSIS — N944 Primary dysmenorrhea: Secondary | ICD-10-CM

## 2017-10-19 ENCOUNTER — Ambulatory Visit: Payer: 59

## 2017-10-20 ENCOUNTER — Ambulatory Visit (INDEPENDENT_AMBULATORY_CARE_PROVIDER_SITE_OTHER): Payer: 59 | Admitting: Family Medicine

## 2017-10-20 ENCOUNTER — Encounter: Payer: Self-pay | Admitting: Family Medicine

## 2017-10-20 VITALS — BP 116/72 | HR 99 | Temp 98.3°F | Resp 16 | Ht 61.0 in | Wt 152.3 lb

## 2017-10-20 DIAGNOSIS — Z124 Encounter for screening for malignant neoplasm of cervix: Secondary | ICD-10-CM

## 2017-10-20 DIAGNOSIS — Z3042 Encounter for surveillance of injectable contraceptive: Secondary | ICD-10-CM

## 2017-10-20 DIAGNOSIS — E559 Vitamin D deficiency, unspecified: Secondary | ICD-10-CM | POA: Diagnosis not present

## 2017-10-20 DIAGNOSIS — E538 Deficiency of other specified B group vitamins: Secondary | ICD-10-CM | POA: Diagnosis not present

## 2017-10-20 DIAGNOSIS — N944 Primary dysmenorrhea: Secondary | ICD-10-CM

## 2017-10-20 DIAGNOSIS — Z131 Encounter for screening for diabetes mellitus: Secondary | ICD-10-CM | POA: Diagnosis not present

## 2017-10-20 DIAGNOSIS — Z23 Encounter for immunization: Secondary | ICD-10-CM

## 2017-10-20 DIAGNOSIS — Z114 Encounter for screening for human immunodeficiency virus [HIV]: Secondary | ICD-10-CM

## 2017-10-20 MED ORDER — MEDROXYPROGESTERONE ACETATE 150 MG/ML IM SUSY: 1.0000 mL | PREFILLED_SYRINGE | INTRAMUSCULAR | 3 refills | Status: DC

## 2017-10-20 MED ORDER — MEDROXYPROGESTERONE ACETATE 150 MG/ML IM SUSY
150.0000 mg | PREFILLED_SYRINGE | INTRAMUSCULAR | Status: DC
  Administered 2017-10-20 – 2018-07-26 (×2): 150 mg via INTRAMUSCULAR

## 2017-10-20 NOTE — Patient Instructions (Signed)
Preventive Care 18-39 Years, Female Preventive care refers to lifestyle choices and visits with your health care provider that can promote health and wellness. What does preventive care include?  A yearly physical exam. This is also called an annual well check.  Dental exams once or twice a year.  Routine eye exams. Ask your health care provider how often you should have your eyes checked.  Personal lifestyle choices, including: ? Daily care of your teeth and gums. ? Regular physical activity. ? Eating a healthy diet. ? Avoiding tobacco and drug use. ? Limiting alcohol use. ? Practicing safe sex. ? Taking vitamin and mineral supplements as recommended by your health care provider. What happens during an annual well check? The services and screenings done by your health care provider during your annual well check will depend on your age, overall health, lifestyle risk factors, and family history of disease. Counseling Your health care provider may ask you questions about your:  Alcohol use.  Tobacco use.  Drug use.  Emotional well-being.  Home and relationship well-being.  Sexual activity.  Eating habits.  Work and work Statistician.  Method of birth control.  Menstrual cycle.  Pregnancy history.  Screening You may have the following tests or measurements:  Height, weight, and BMI.  Diabetes screening. This is done by checking your blood sugar (glucose) after you have not eaten for a while (fasting).  Blood pressure.  Lipid and cholesterol levels. These may be checked every 5 years starting at age 66.  Skin check.  Hepatitis C blood test.  Hepatitis B blood test.  Sexually transmitted disease (STD) testing.  BRCA-related cancer screening. This may be done if you have a family history of breast, ovarian, tubal, or peritoneal cancers.  Pelvic exam and Pap test. This may be done every 3 years starting at age 40. Starting at age 59, this may be done every 5  years if you have a Pap test in combination with an HPV test.  Discuss your test results, treatment options, and if necessary, the need for more tests with your health care provider. Vaccines Your health care provider may recommend certain vaccines, such as:  Influenza vaccine. This is recommended every year.  Tetanus, diphtheria, and acellular pertussis (Tdap, Td) vaccine. You may need a Td booster every 10 years.  Varicella vaccine. You may need this if you have not been vaccinated.  HPV vaccine. If you are 69 or younger, you may need three doses over 6 months.  Measles, mumps, and rubella (MMR) vaccine. You may need at least one dose of MMR. You may also need a second dose.  Pneumococcal 13-valent conjugate (PCV13) vaccine. You may need this if you have certain conditions and were not previously vaccinated.  Pneumococcal polysaccharide (PPSV23) vaccine. You may need one or two doses if you smoke cigarettes or if you have certain conditions.  Meningococcal vaccine. One dose is recommended if you are age 27-21 years and a first-year college student living in a residence hall, or if you have one of several medical conditions. You may also need additional booster doses.  Hepatitis A vaccine. You may need this if you have certain conditions or if you travel or work in places where you may be exposed to hepatitis A.  Hepatitis B vaccine. You may need this if you have certain conditions or if you travel or work in places where you may be exposed to hepatitis B.  Haemophilus influenzae type b (Hib) vaccine. You may need this if  you have certain risk factors.  Talk to your health care provider about which screenings and vaccines you need and how often you need them. This information is not intended to replace advice given to you by your health care provider. Make sure you discuss any questions you have with your health care provider. Document Released: 07/07/2001 Document Revised: 01/29/2016  Document Reviewed: 03/12/2015 Elsevier Interactive Patient Education  [employer].

## 2017-10-20 NOTE — Progress Notes (Signed)
Name: Shelley Davis   MRN: [id number]    DOB: 1996-01-12   Date:10/20/2017       Progress Note  Subjective  Chief Complaint  Chief Complaint  Patient presents with  . Annual Exam    HPI   Patient presents for annual CPE   Diet: discussed healthy diet and high calcium and vitamin D level  Exercise: discussed importance of physical activity   USPSTF grade A and B recommendations  Depression:  Depression screen [facility] 2/9 10/20/2017 01/14/2017 11/09/2016 07/08/2016 12/05/2015  Decreased Interest 0 0 0 0 0  Down, Depressed, Hopeless 0 0 0 0 0  PHQ - 2 Score 0 0 0 0 0  Altered sleeping 2 - - - -  Tired, decreased energy 2 - - - -  Change in appetite 1 - - - -  Feeling bad or failure about yourself  0 - - - -  Trouble concentrating 0 - - - -  Moving slowly or fidgety/restless 0 - - - -  Suicidal thoughts 0 - - - -  PHQ-9 Score 5 - - - -   Hypertension: BP Readings from Last 3 Encounters:  10/20/17 116/72  01/20/17 126/74  01/14/17 126/78   Obesity: Wt Readings from Last 3 Encounters:  10/20/17 152 lb 4.8 oz (69.1 kg)  01/20/17 152 lb (68.9 kg)  01/14/17 152 lb 9.6 oz (69.2 kg)   BMI Readings from Last 3 Encounters:  10/20/17 28.78 kg/m  01/20/17 29.69 kg/m  01/14/17 29.80 kg/m    Alcohol: discussed risk of binge drinking or drinking and driving  HIV, hep B, hep C: wants HIV but not others STD testing and prevention (chl/gon/syphilis): check today  Intimate partner violence: negative  Sexual History/Pain during Intercourse: none Menstrual History/LMP/Abnormal Bleeding: no cycles on depo  Incontinence Symptoms: no symptoms  Advanced Care Planning: A voluntary discussion about advance care planning including the explanation and discussion of advance directives.  Discussed health care proxy and Living will, and the patient was able to identify a health care proxy as mother .  Patient does not have a living will at present time.  Cervical cancer screening: due today     Lipids:  Lab Results  Component Value Date   CHOL 184 07/08/2016   Lab Results  Component Value Date   HDL 70 07/08/2016   Lab Results  Component Value Date   LDLCALC 106 (H) 07/08/2016   Lab Results  Component Value Date   TRIG 38 07/08/2016   Lab Results  Component Value Date   CHOLHDL 2.6 07/08/2016   No results found for: LDLDIRECT  Glucose:  Glucose, Bld  Date Value Ref Range Status  01/14/2017 86 65 - 99 mg/dL Final  11/25/2016 101 (H) 65 - 99 mg/dL Final  07/08/2016 87 65 - 99 mg/dL Final     Patient Active Problem List   Diagnosis Date Noted  . Asthma, mild intermittent 08/05/2016  . Low serum vitamin B12 08/05/2016  . Primary dysmenorrhea 07/08/2016  . Perennial allergic rhinitis with seasonal variation 12/05/2015  . Allergic to peanuts 12/05/2015  . Vitamin D deficiency 12/05/2015  . Eczema 12/05/2015    Past Surgical History:  Procedure Laterality Date  . WISDOM TOOTH EXTRACTION Bilateral     Family History  Problem Relation Age of Onset  . Hypertension Mother   . Asthma Sister   . Lung disease Maternal Grandmother     Social History   Socioeconomic History  . Marital status: Single  Spouse name: Not on file  . Number of children: 0  . Years of education: 34  . Highest education level: High school graduate  Occupational History  . Occupation: Freight forwarder: [employer]  Social Needs  . Financial resource strain: Not hard at all  . Food insecurity:    Worry: Never true    Inability: Never true  . Transportation needs:    Medical: No    Non-medical: No  Tobacco Use  . Smoking status: Never Smoker  . Smokeless tobacco: Never Used  Substance and Sexual Activity  . Alcohol use: Yes    Comment: occasionally  . Drug use: No  . Sexual activity: Yes    Partners: Male    Birth control/protection: Injection  Lifestyle  . Physical activity:    Days per week: 0 days    Minutes per session: 0 min  . Stress: Not at all   Relationships  . Social connections:    Talks on phone: More than three times a week    Gets together: Three times a week    Attends religious service: Never    Active member of club or organization: No    Attends meetings of clubs or organizations: Never    Relationship status: Never married  . Intimate partner violence:    Fear of current or ex partner: No    Emotionally abused: No    Physically abused: No    Forced sexual activity: No  Other Topics Concern  . 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  albuterol (PROAIR HFA) 108 (90 Base) MCG/ACT inhaler, Inhale 2 puffs into the lungs every 6 (six) hours as needed for wheezing or shortness of breath., Disp: 1 Inhaler, Rfl: 0 .  azelastine (OPTIVAR) 0.05 % ophthalmic solution, Place 2 drops into both eyes 2 (two) times daily., Disp: , Rfl:  .  budesonide-formoterol (SYMBICORT) 160-4.5 MCG/ACT inhaler, Inhale 2 puffs into the lungs 2 (two) times daily., Disp: 1 Inhaler, Rfl: 2 .  EPIPEN 2-PAK 0.3 MG/0.3ML SOAJ injection, Inject 0.3 mLs into the skin once as needed for anaphylaxis., Disp: , Rfl: 1 .  fluticasone (FLONASE) 50 MCG/ACT nasal spray, Place 2 sprays into both nostrils daily., Disp: 16 g, Rfl: 2 .  ketoconazole (NIZORAL) 2 % shampoo, SHAMPOO SCALP AND FACE (LEAVE ON FOR FIVE MINUTES, THEN RINSE) 2 -3 X WEEKLY, Disp: , Rfl: 10 .  loratadine (CLARITIN) 10 MG tablet, Take 1 tablet (10 mg total) by mouth daily., Disp: 30 tablet, Rfl: 0 .  medroxyPROGESTERone Acetate 150 MG/ML SUSY, Inject 1 mL (150 mg total) into the muscle every 3 (three) months., Disp: 1 Syringe, Rfl: 3 .  montelukast (SINGULAIR) 10 MG tablet, Take 1 tablet (10 mg total) by mouth at bedtime., Disp: 90 tablet, Rfl: 1 .  ranitidine (ZANTAC) 150 MG tablet, TAKE 1 TABLET (150 MG TOTAL) BY MOUTH 2 (TWO) TIMES DAILY AS NEEDED FOR  HEARTBURN., Disp: 90 tablet, Rfl: 0  Current Facility-Administered Medications:  .  medroxyPROGESTERone Acetate SUSY 150 mg, 150 mg, Intramuscular, Q90 days, Steele Sizer, MD, 150 mg at 10/20/17 1026  Allergies  Allergen Reactions  . Peanut Oil   . Peanut-Containing Drug Products Itching and Other (See Comments)    Throat swelling and becomes itchy  ROS   Constitutional: Negative for fever or weight change.  Respiratory: Negative for cough , but has intermittent  shortness of breath.   Cardiovascular: Negative for chest pain or palpitations.  Gastrointestinal: Negative for abdominal pain, no bowel changes.  Musculoskeletal: Negative for gait problem or joint swelling.  Skin: Negative for rash.  Neurological: Negative for dizziness or headache.  No other specific complaints in a complete review of systems (except as listed in HPI above).   Objective  Vitals:   10/20/17 1016  BP: 116/72  Pulse: 99  Resp: 16  Temp: 98.3 F (36.8 C)  TempSrc: Oral  SpO2: 99%  Weight: 152 lb 4.8 oz (69.1 kg)  Height: 5\' 1"  (1.549 m)    Body mass index is 28.78 kg/m.  Physical Exam  Constitutional: Patient appears well-developed and well-nourished. No distress.  HENT: Head: Normocephalic and atraumatic. Ears: B TMs ok, no erythema or effusion; Nose: Nose normal. Mouth/Throat: Oropharynx is clear and moist. No oropharyngeal exudate.  Eyes: Conjunctivae and EOM are normal. Pupils are equal, round, and reactive to light. No scleral icterus.  Neck: Normal range of motion. Neck supple. No JVD present. No thyromegaly present.  Cardiovascular: Normal rate, regular rhythm and normal heart sounds.  No murmur heard. No BLE edema. Pulmonary/Chest: Effort normal and breath sounds normal. No respiratory distress. Abdominal: Soft. Bowel sounds are normal, no distension. There is no tenderness. no masses Breast: no lumps or masses, no nipple discharge or rashes FEMALE GENITALIA:  External  genitalia normal External urethra normal Vaginal vault normal without discharge or lesions Cervix normal without discharge or lesions Bimanual exam normal without masses RECTAL: not done Musculoskeletal: Normal range of motion, no joint effusions. No gross deformities Neurological: he is alert and oriented to person, place, and time. No cranial nerve deficit. Coordination, balance, strength, speech and gait are normal.  Skin: Skin is warm and dry. No rash noted. No erythema.  Psychiatric: Patient has a normal mood and affect. behavior is normal. Judgment and thought content normal.     PHQ2/9: Depression screen [facility] 2/9 10/20/2017 01/14/2017 11/09/2016 07/08/2016 12/05/2015  Decreased Interest 0 0 0 0 0  Down, Depressed, Hopeless 0 0 0 0 0  PHQ - 2 Score 0 0 0 0 0  Altered sleeping 2 - - - -  Tired, decreased energy 2 - - - -  Change in appetite 1 - - - -  Feeling bad or failure about yourself  0 - - - -  Trouble concentrating 0 - - - -  Moving slowly or fidgety/restless 0 - - - -  Suicidal thoughts 0 - - - -  PHQ-9 Score 5 - - - -   Does not feel sad, just tired, off vitamin D and B12 supplements we will recheck labs  Fall Risk: Fall Risk  10/20/2017 01/14/2017 11/09/2016 07/08/2016 12/05/2015  Falls in the past year? No No No No No    Functional Status Survey: Is the patient deaf or have difficulty hearing?: No Does the patient have difficulty seeing, even when wearing glasses/contacts?: No Does the patient have difficulty concentrating, remembering, or making decisions?: No Does the patient have difficulty walking or climbing stairs?: No Does the patient have difficulty dressing or bathing?: No Does the patient have difficulty doing errands alone such as visiting a doctor's office or shopping?: No   Assessment & Plan  1. Encounter for surveillance of injectable contraceptive  Discussed importance of 150 minutes of physical activity weekly, eat two servings  of fish weekly, eat  one serving of tree nuts ( cashews, pistachios, pecans, almonds.Marland Kitchen) every other day, eat 6 servings of fruit/vegetables daily and drink plenty of water and avoid sweet beverages.  - medroxyPROGESTERone Acetate SUSY 150 mg - medroxyPROGESTERone Acetate 150 MG/ML SUSY; Inject 1 mL (150 mg total) into the muscle every 3 (three) months.  Dispense: 1 Syringe; Refill: 3 - CBC with Differential/Platelet  2. Primary dysmenorrhea  - medroxyPROGESTERone Acetate SUSY 150 mg - medroxyPROGESTERone Acetate 150 MG/ML SUSY; Inject 1 mL (150 mg total) into the muscle every 3 (three) months.  Dispense: 1 Syringe; Refill: 3  3. Need for hepatitis B vaccination  - Hepatitis B vaccine adult IM  4. Cervical cancer screening  - Pap IG and Chlamydia/Gonococcus, NAA  5. Encounter for screening for HIV  - HIV antibody  6. Diabetes mellitus screening  - Hemoglobin A1c  7. Low serum vitamin B12  - Vitamin B12  8. Vitamin D deficiency  - VITAMIN D 25 Hydroxy (Vit-D Deficiency, Fractures)

## 2017-10-21 LAB — CBC WITH DIFFERENTIAL/PLATELET
BASOS ABS: 31 {cells}/uL (ref 0–200)
Basophils Relative: 0.4 %
Eosinophils Absolute: 133 cells/uL (ref 15–500)
Eosinophils Relative: 1.7 %
HCT: 37.9 % (ref 35.0–45.0)
Hemoglobin: 12.5 g/dL (ref 11.7–15.5)
Lymphs Abs: 2870 cells/uL (ref 850–3900)
MCH: 25.5 pg — AB (ref 27.0–33.0)
MCHC: 33 g/dL (ref 32.0–36.0)
MCV: 77.3 fL — ABNORMAL LOW (ref 80.0–100.0)
MONOS PCT: 7.2 %
MPV: 8.5 fL (ref 7.5–12.5)
NEUTROS PCT: 53.9 %
Neutro Abs: 4204 cells/uL (ref 1500–7800)
PLATELETS: 341 10*3/uL (ref 140–400)
RBC: 4.9 10*6/uL (ref 3.80–5.10)
RDW: 13.2 % (ref 11.0–15.0)
TOTAL LYMPHOCYTE: 36.8 %
WBC mixed population: 562 cells/uL (ref 200–950)
WBC: 7.8 10*3/uL (ref 3.8–10.8)

## 2017-10-21 LAB — VITAMIN D 25 HYDROXY (VIT D DEFICIENCY, FRACTURES): Vit D, 25-Hydroxy: 23 ng/mL — ABNORMAL LOW (ref 30–100)

## 2017-10-21 LAB — PAP IG AND CT-NG NAA
C. trachomatis RNA, TMA: NOT DETECTED
N. GONORRHOEAE RNA, TMA: NOT DETECTED

## 2017-10-21 LAB — HEMOGLOBIN A1C
Hgb A1c MFr Bld: 5.5 % of total Hgb (ref <5.7)
MEAN PLASMA GLUCOSE: 111 (calc)
eAG (mmol/L): 6.2 (calc)

## 2017-10-21 LAB — HIV ANTIBODY (ROUTINE TESTING W REFLEX): HIV 1&2 Ab, 4th Generation: NONREACTIVE

## 2017-10-21 LAB — VITAMIN B12: Vitamin B-12: 388 pg/mL (ref 200–1100)

## 2017-10-25 ENCOUNTER — Telehealth: Payer: Self-pay

## 2017-10-25 NOTE — Telephone Encounter (Signed)
-----   Message from Steele Sizer, MD sent at 10/22/2017  3:09 PM EDT ----- Normal pap , gonorrhea and chlamydia negative HIV negative Normal hgbA1C Normal CBC, except for low size of red blood cells - please add iron studies ( iron, TIBC and ferritin)  Vitamin D is improving, needs to take otc vitamin D 2000 units daily  B!2 is towards low end of normal, she can take B12 a few times a week

## 2017-10-25 NOTE — Telephone Encounter (Signed)
Patient called.  Unable to reach patient. If patient calls back please inform her of her most recent lab results.  

## 2018-02-02 ENCOUNTER — Ambulatory Visit (INDEPENDENT_AMBULATORY_CARE_PROVIDER_SITE_OTHER): Payer: 59

## 2018-02-02 DIAGNOSIS — Z3042 Encounter for surveillance of injectable contraceptive: Secondary | ICD-10-CM | POA: Diagnosis not present

## 2018-02-02 LAB — POCT URINE PREGNANCY: Preg Test, Ur: NEGATIVE

## 2018-02-02 MED ORDER — MEDROXYPROGESTERONE ACETATE 150 MG/ML IM SUSP
150.0000 mg | Freq: Once | INTRAMUSCULAR | Status: AC
  Administered 2018-02-02: 150 mg via INTRAMUSCULAR

## 2018-02-03 ENCOUNTER — Encounter: Payer: Self-pay | Admitting: Family Medicine

## 2018-02-03 ENCOUNTER — Ambulatory Visit (INDEPENDENT_AMBULATORY_CARE_PROVIDER_SITE_OTHER): Payer: 59 | Admitting: Family Medicine

## 2018-02-03 VITALS — BP 120/62 | HR 100 | Temp 98.9°F | Resp 16 | Ht 61.0 in | Wt 157.3 lb

## 2018-02-03 DIAGNOSIS — S50862A Insect bite (nonvenomous) of left forearm, initial encounter: Secondary | ICD-10-CM | POA: Diagnosis not present

## 2018-02-03 DIAGNOSIS — W57XXXA Bitten or stung by nonvenomous insect and other nonvenomous arthropods, initial encounter: Secondary | ICD-10-CM | POA: Diagnosis not present

## 2018-02-03 MED ORDER — TRIAMCINOLONE ACETONIDE 0.025 % EX CREA: 1.0000 "application " | TOPICAL_CREAM | Freq: Two times a day (BID) | CUTANEOUS | 0 refills | Status: DC

## 2018-02-03 NOTE — Patient Instructions (Addendum)
You may take benadryl at night as needed for itching.  Insect Bite, Adult An insect bite can make your skin red, itchy, and swollen. Some insects can spread disease to people with a bite. However, most insect bites do not lead to disease, and most are not serious. Follow these instructions at home: Bite area care  Do not scratch the bite area.  Keep the bite area clean and dry.  Wash the bite area every day with soap and water as told by your doctor.  Check the bite area every day for signs of infection. Check for: ? More redness, swelling, or pain. ? Fluid or blood. ? Warmth. ? Pus. Managing pain, itching, and swelling  You may put any of these on the bite area as told by your doctor: ? A baking soda paste. ? Cortisone cream. ? Calamine lotion.  If directed, put ice on the bite area. ? Put ice in a plastic bag. ? Place a towel between your skin and the bag. ? Leave the ice on for 20 minutes, 2-3 times a day. Medicines  Take medicines or put medicines on your skin only as told by your doctor.  If you were prescribed an antibiotic medicine, use it as told by your doctor. Do not stop using the antibiotic even if your condition improves. General instructions  Keep all follow-up visits as told by your doctor. This is important. How is this prevented? To help you have a lower risk of insect bites:  When you are outside, wear clothing that covers your arms and legs.  Use insect repellent. The best insect repellents have: ? An active ingredient of DEET, picaridin, oil of lemon eucalyptus (OLE), or IR3535. ? Higher amounts of DEET or another active ingredient than other repellents have.  If your home windows do not have screens, think about putting some in.  Contact a doctor if:  You have more redness, swelling, or pain in the bite area.  You have fluid, blood, or pus coming from the bite area.  The bite area feels warm.  You have a fever. Get help right away  if:  You have joint pain.  You have a rash.  You have shortness of breath.  You feel more tired or sleepy than you normally do.  You have neck pain.  You have a headache.  You feel weaker than you normally do.  You have chest pain.  You have pain in your belly.  You feel sick to your stomach (nauseous) or you throw up (vomit). Summary  An insect bite can make your skin red, itchy, and swollen.  Do not scratch the bite area, and keep it clean and dry.  Ice can help with pain and itching from the bite. This information is not intended to replace advice given to you by your health care provider. Make sure you discuss any questions you have with your health care provider. Document Released: 05/08/2000 Document Revised: 12/12/2015 Document Reviewed: 09/26/2014 Elsevier Interactive Patient Education  2018 [employer].

## 2018-02-03 NOTE — Progress Notes (Signed)
Name: Shelley Davis   MRN: [id number]    DOB: July 05, 1995   Date:02/03/2018       Progress Note  Subjective  Chief Complaint  Chief Complaint  Patient presents with  . Insect Bite    left forearm for 5 days    HPI  Pt presents with concern for insect bite to the left forearm x6 days.  She thought it was a mosquito bite at first, but noticed two small puncture sites and got concerned that it was a spider bite. She denies chest pain; does have shortness of breath with her asthma - recent flare with seasonal allergies.  Denies throat/oral swelling. Denies welling to the left forearm, no tenderness, no drainage.  Patient Active Problem List   Diagnosis Date Noted  . Asthma, mild intermittent 08/05/2016  . Low serum vitamin B12 08/05/2016  . Primary dysmenorrhea 07/08/2016  . Perennial allergic rhinitis with seasonal variation 12/05/2015  . Allergic to peanuts 12/05/2015  . Vitamin D deficiency 12/05/2015  . Eczema 12/05/2015    Social History   Tobacco Use  . Smoking status: Never Smoker  . Smokeless tobacco: Never Used  Substance Use Topics  . Alcohol use: Yes    Comment: occasionally     Current Outpatient Medications:  .  albuterol (PROAIR HFA) 108 (90 Base) MCG/ACT inhaler, Inhale 2 puffs into the lungs every 6 (six) hours as needed for wheezing or shortness of breath., Disp: 1 Inhaler, Rfl: 0 .  azelastine (OPTIVAR) 0.05 % ophthalmic solution, Place 2 drops into both eyes 2 (two) times daily., Disp: , Rfl:  .  budesonide-formoterol (SYMBICORT) 160-4.5 MCG/ACT inhaler, Inhale 2 puffs into the lungs 2 (two) times daily., Disp: 1 Inhaler, Rfl: 2 .  EPIPEN 2-PAK 0.3 MG/0.3ML SOAJ injection, Inject 0.3 mLs into the skin once as needed for anaphylaxis., Disp: , Rfl: 1 .  fluticasone (FLONASE) 50 MCG/ACT nasal spray, Place 2 sprays into both nostrils daily., Disp: 16 g, Rfl: 2 .  ketoconazole (NIZORAL) 2 % shampoo, SHAMPOO SCALP AND FACE (LEAVE ON FOR FIVE MINUTES, THEN RINSE)  2 -3 X WEEKLY, Disp: , Rfl: 10 .  medroxyPROGESTERone Acetate 150 MG/ML SUSY, Inject 1 mL (150 mg total) into the muscle every 3 (three) months., Disp: 1 Syringe, Rfl: 3 .  montelukast (SINGULAIR) 10 MG tablet, Take 1 tablet (10 mg total) by mouth at bedtime., Disp: 90 tablet, Rfl: 1 .  ranitidine (ZANTAC) 150 MG tablet, TAKE 1 TABLET (150 MG TOTAL) BY MOUTH 2 (TWO) TIMES DAILY AS NEEDED FOR HEARTBURN., Disp: 90 tablet, Rfl: 0 .  loratadine (CLARITIN) 10 MG tablet, Take 1 tablet (10 mg total) by mouth daily. (Patient not taking: Reported on 02/03/2018), Disp: 30 tablet, Rfl: 0  Current Facility-Administered Medications:  .  medroxyPROGESTERone Acetate SUSY 150 mg, 150 mg, Intramuscular, Q90 days, Steele Sizer, MD, 150 mg at 10/20/17 1026  Allergies  Allergen Reactions  . Peanut Oil   . Peanut-Containing Drug Products Itching and Other (See Comments)    Throat swelling and becomes itchy     ROS  Ten systems reviewed and is negative except as mentioned in HPI  Objective  Vitals:   02/03/18 0937  BP: 120/62  Pulse: 100  Resp: 16  Temp: 98.9 F (37.2 C)  TempSrc: Oral  SpO2: 99%  Weight: 157 lb 4.8 oz (71.4 kg)  Height: 5\' 1"  (1.549 m)    Body mass index is 29.72 kg/m.  Nursing Note and Vital Signs reviewed.  Physical  Exam  Constitutional: Patient appears well-developed and well-nourished. No distress.  HENT: Head: Normocephalic and atraumatic.Neck: Normal range of motion. Neck supple.  Cardiovascular: Normal rate, regular rhythm and normal heart sounds.  No murmur heard. No BLE edema. Pulmonary/Chest: Effort normal and breath sounds normal. No respiratory distress. Musculoskeletal: Normal range of motion, no joint effusions. No gross deformities Neurological: he is alert and oriented to person, place, and time. No cranial nerve deficit. Coordination, balance, strength, speech and gait are normal.  Skin: Skin is warm and dry. Nickel-sized area of erythema with no  induration or fluctuance, the center has two very small puncture sites compatible with insect bite theory.  No exudate, no excoriation. Psychiatric: Patient has a normal mood and affect. behavior is normal. Judgment and thought content normal.  Results for orders placed or performed in visit on 02/02/18 (from the past 72 hour(s))  POCT urine pregnancy     Status: Normal   Collection Time: 02/02/18 10:14 AM  Result Value Ref Range   Preg Test, Ur Negative Negative    Assessment & Plan   1. Insect bite of left forearm, initial encounter - triamcinolone (KENALOG) 0.025 % cream; Apply 1 application topically 2 (two) times daily.  Dispense: 30 g; Refill: 0 - Continue Claritin and singulair; benadryl at night PRN  -Red flags and when to present for emergency care or RTC including fever >101.59F, chest pain, shortness of breath, new/worsening/un-resolving symptoms, oral/throat swelling reviewed with patient at time of visit. Follow up and care instructions discussed and provided in AVS.

## 2018-02-05 IMAGING — US US ART/VEN ABD/PELV/SCROTUM DOPPLER LTD
1 series · 14 of 25 positions shown · non-contrast
Comparison: None.

CLINICAL DATA: Two week history pelvic pain.

EXAM:
TRANSABDOMINAL AND TRANSVAGINAL ULTRASOUND OF PELVIS
DOPPLER ULTRASOUND OF OVARIES
TECHNIQUE: Both transabdominal and transvaginal ultrasound examinations of the
pelvis were performed. Transabdominal technique was performed for
global imaging of the pelvis including uterus, ovaries, adnexal
regions, and pelvic cul-de-sac.
It was necessary to proceed with endovaginal exam following the
transabdominal exam to visualize the ovaries. Color and duplex
Doppler ultrasound was utilized to evaluate blood flow to the
ovaries.

[Series 1: us art/ven abd/pelv/scrotum doppler ltd · 0.22mm/px · 14 of 84 slices shown]
[im 1/84]
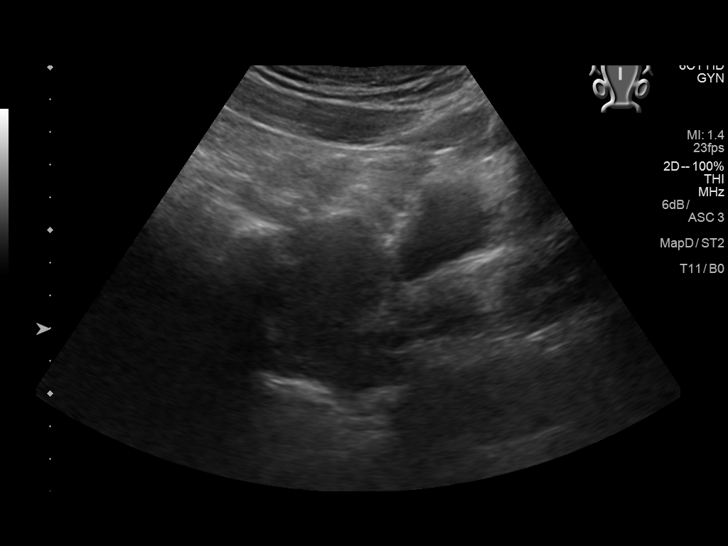
[im 7/84]
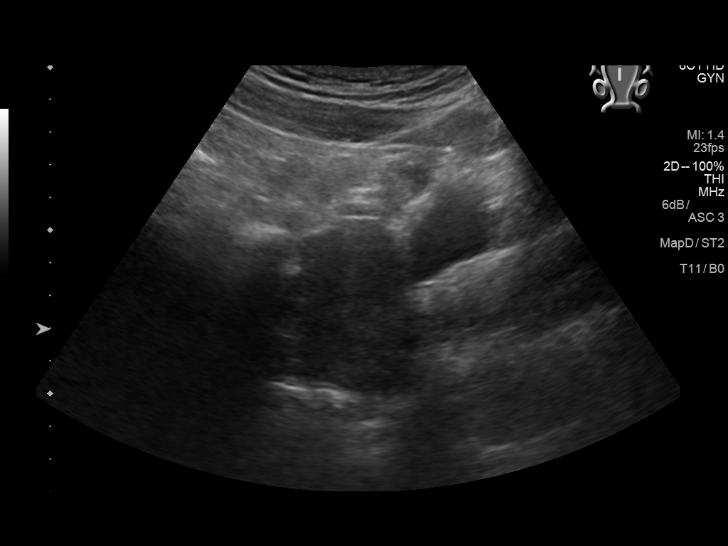
[im 14/84]
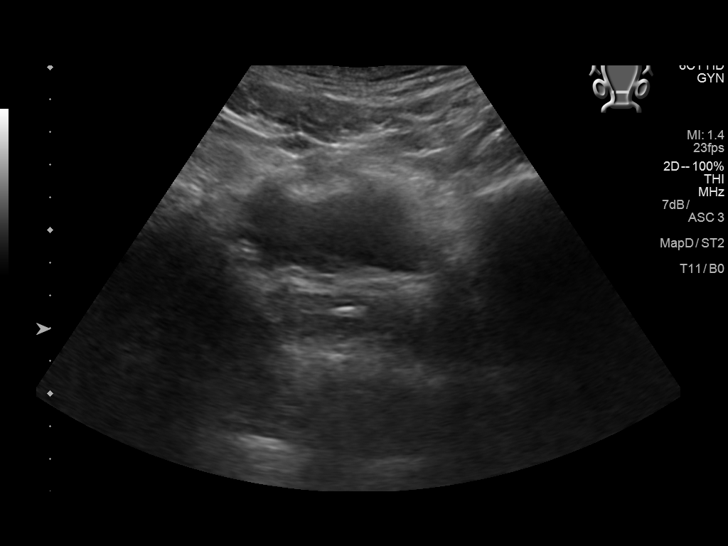
[im 21/84]
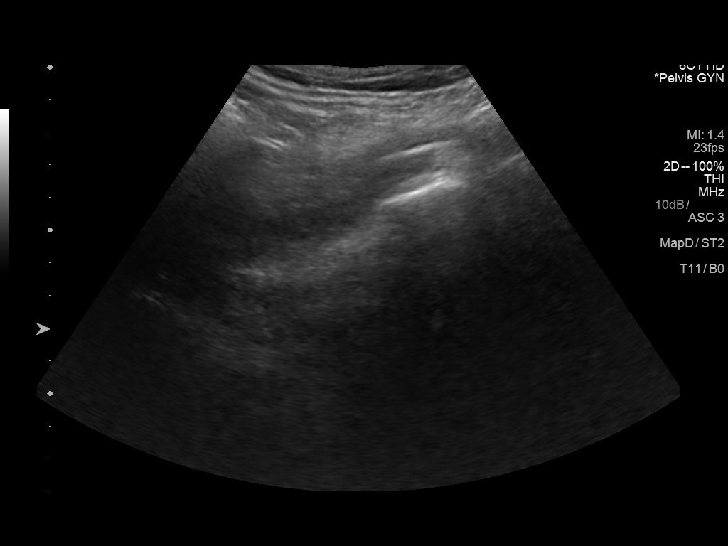
[im 28/84]
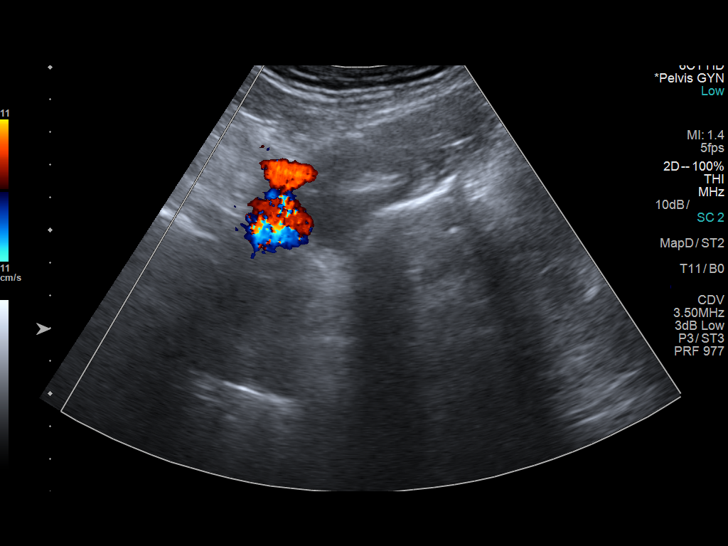
[im 32/84]
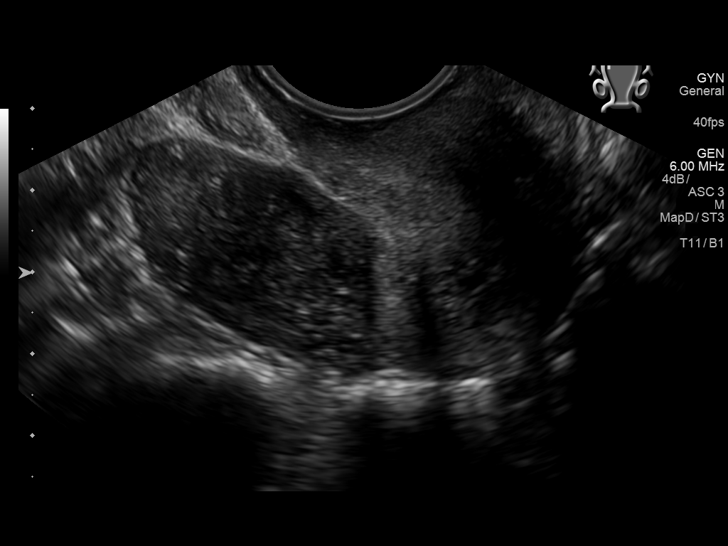
[im 39/84]
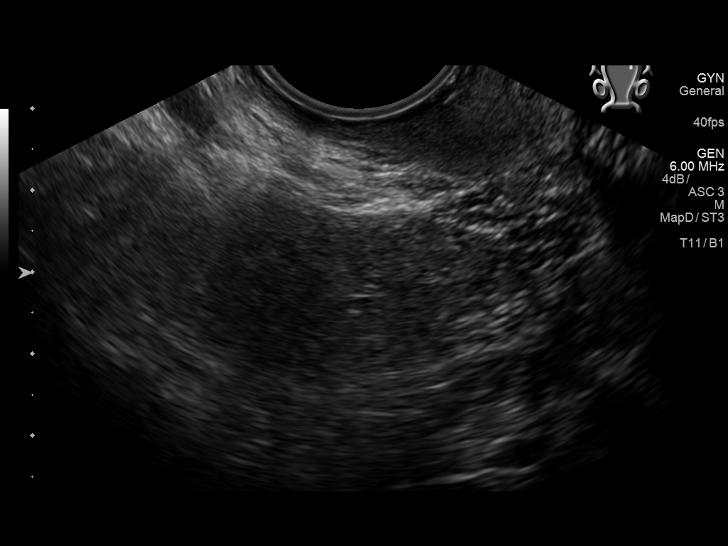
[im 45/84]
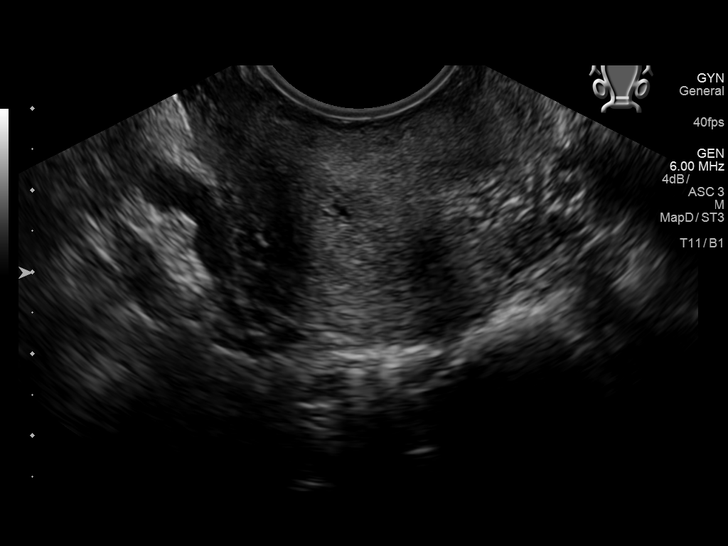
[im 52/84]
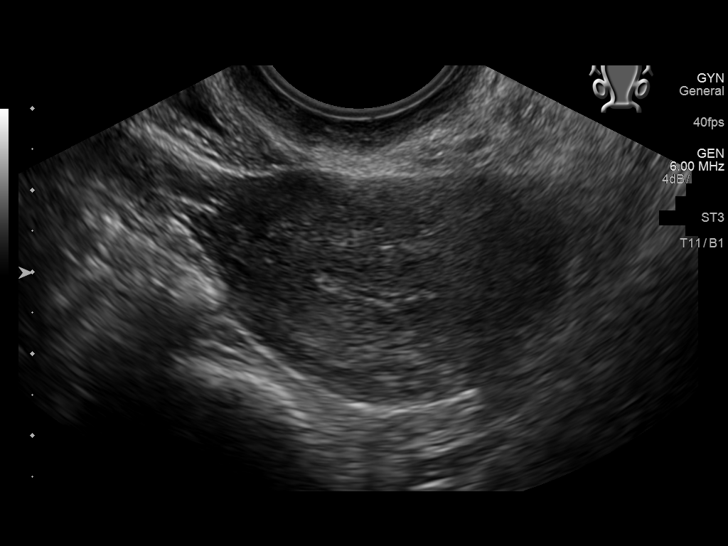
[im 56/84]
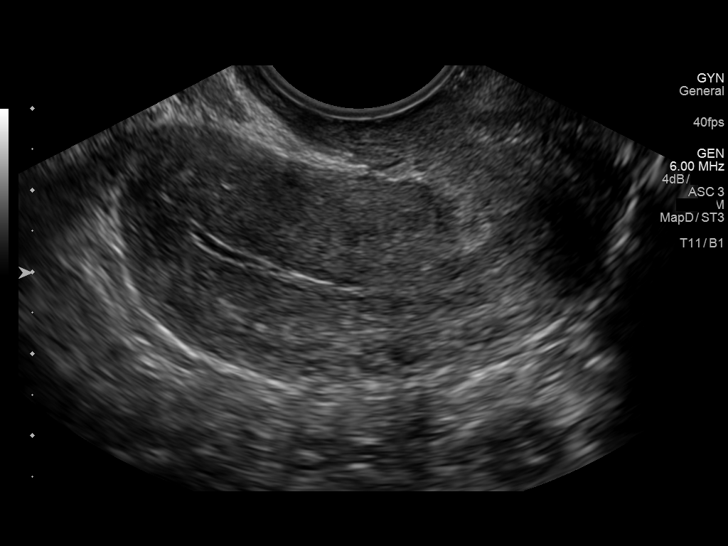
[im 63/84]
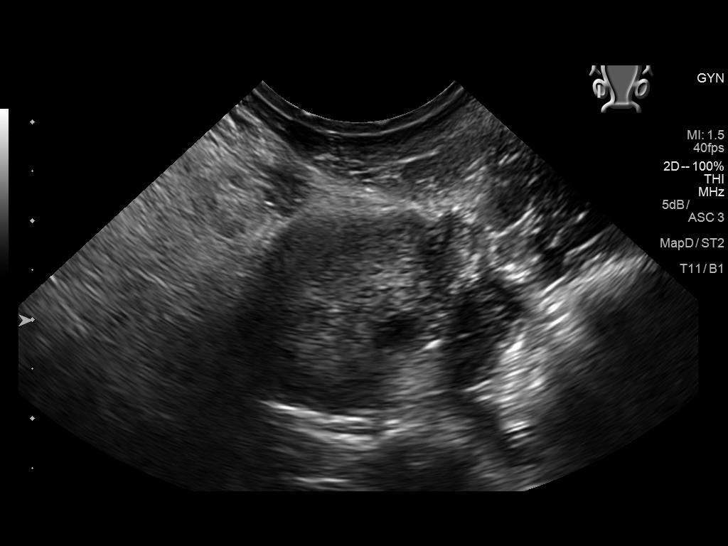
[im 70/84]
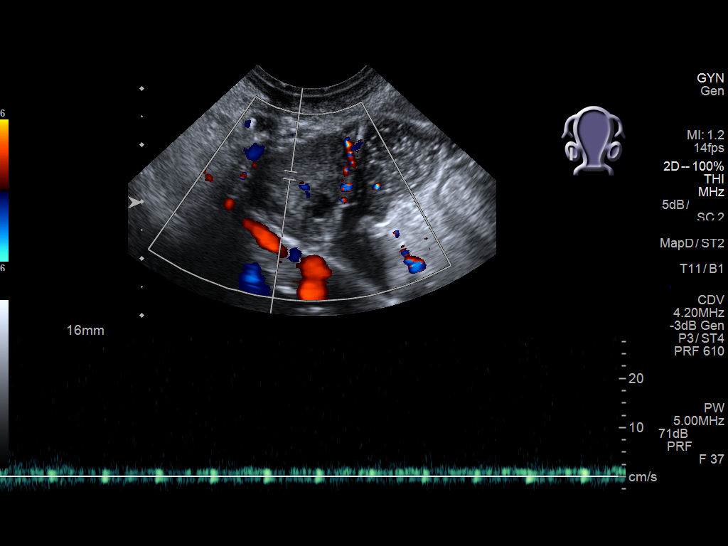
[im 77/84]
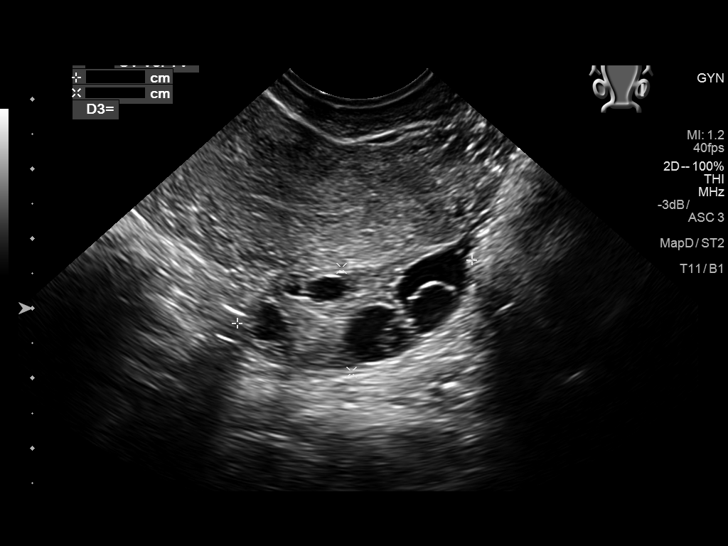
[im 84/84]
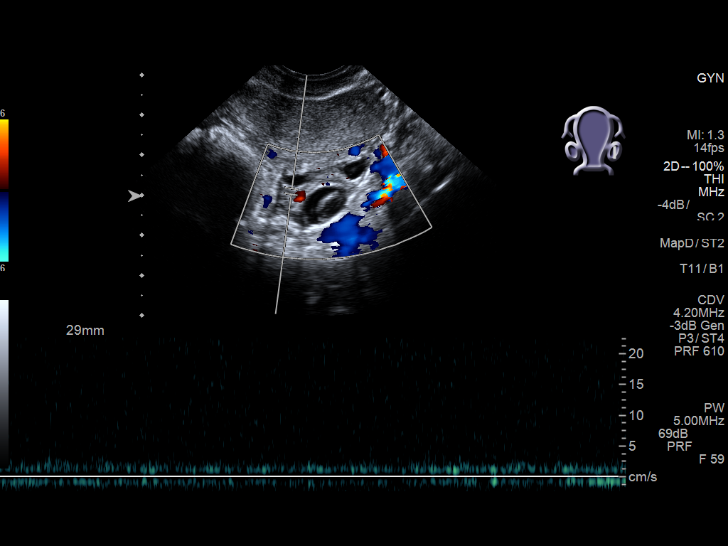

[14 of 25 positions shown; findings below may reference images not displayed]

FINDINGS: Uterus

Measurements: 6.1 x 2.8 x 4.4 cm. No fibroids or other mass
visualized.

Endometrium

Thickness: 1-2 mm.  Trace fluid noted in the endometrial canal.

Right ovary

Measurements: 2.4 x 1.9 x 1.6 cm. Normal appearance/no adnexal mass.

Left ovary

Measurements: 3.5 x 1.5 x 1.9 cm. Normal appearance/no adnexal mass.

Pulsed Doppler evaluation of both ovaries demonstrates normal
low-resistance arterial and venous waveforms.

Other findings

No abnormal free fluid.
IMPRESSION: Unremarkable pelvic ultrasound exam.

## 2018-03-28 ENCOUNTER — Encounter: Payer: Self-pay | Admitting: Family Medicine

## 2018-03-28 ENCOUNTER — Ambulatory Visit (INDEPENDENT_AMBULATORY_CARE_PROVIDER_SITE_OTHER): Payer: 59 | Admitting: Family Medicine

## 2018-03-28 VITALS — BP 110/70 | HR 100 | Temp 97.6°F | Resp 18 | Ht 61.0 in | Wt 161.6 lb

## 2018-03-28 DIAGNOSIS — E538 Deficiency of other specified B group vitamins: Secondary | ICD-10-CM

## 2018-03-28 DIAGNOSIS — Z3042 Encounter for surveillance of injectable contraceptive: Secondary | ICD-10-CM | POA: Diagnosis not present

## 2018-03-28 DIAGNOSIS — R51 Headache: Secondary | ICD-10-CM | POA: Diagnosis not present

## 2018-03-28 DIAGNOSIS — R42 Dizziness and giddiness: Secondary | ICD-10-CM

## 2018-03-28 DIAGNOSIS — R519 Headache, unspecified: Secondary | ICD-10-CM

## 2018-03-28 NOTE — Patient Instructions (Addendum)
Please take Tylenol and Ibuprofen as needed for headache pain (may alternate these two medciations).   Headache Tips:  Keep a headache log: Record the date and time your headaches start and end, the severity of your pain (0-10), any possible triggers, what treatments you tried, and if any of those treatments worked.  Common Headache Triggers: Certain food additives, alcohol, artificial sweeteners (like aspartame), caffeine (overuse and when you stop suddenly), delayed or skipped meals, exercise, certain foods (chocolate, soft cheeses, red wine), bright lights, loud noises, menses, certain odors, oral contraceptives, depression and anxiety, sleep issues, smoke inhalation (smoking or second-hand smoke), stress, weather changes  Please work to reduce your stress by taking at least 30 minutes to exercise, stretch, and/or meditate every day.  Drink plenty of water throughout the day, do not skip meals, and eat healthy foods that are not fried, fatty, high in sugar, or processed.

## 2018-03-28 NOTE — Progress Notes (Signed)
Name: Shelley Davis   MRN: [id number]    DOB: April 13, 1996   Date:03/28/2018       Progress Note  Subjective  Chief Complaint  Chief Complaint  Patient presents with  . Dizziness    HPI  Pt presents with concern for headache and lightheadedness for about 4 days now.  Headache is happening for >4 hours each day and is 7/10 pain that is described as throbbing.  Lightheadedness is improved after drinking water, but still occurs when she goes from sitting/lying to standing.  Endorses nausea as well.  Denies photo/phono-sensitivity, no vomiting, falling, feeling off balance, vision changes, slurred speech, confusion, vision changes, facial droop, limb weakness/numbness/tingling.  Does have history of anemia and low B12. Does have heavy periods - she is on Depo.  Tylenol does help temporarily for her headache.   Patient Active Problem List   Diagnosis Date Noted  . Asthma, mild intermittent 08/05/2016  . Low serum vitamin B12 08/05/2016  . Primary dysmenorrhea 07/08/2016  . Perennial allergic rhinitis with seasonal variation 12/05/2015  . Allergic to peanuts 12/05/2015  . Vitamin D deficiency 12/05/2015  . Eczema 12/05/2015    Social History   Tobacco Use  . Smoking status: Never Smoker  . Smokeless tobacco: Never Used  Substance Use Topics  . Alcohol use: Yes    Comment: occasionally     Current Outpatient Medications:  .  albuterol (PROAIR HFA) 108 (90 Base) MCG/ACT inhaler, Inhale 2 puffs into the lungs every 6 (six) hours as needed for wheezing or shortness of breath., Disp: 1 Inhaler, Rfl: 0 .  azelastine (OPTIVAR) 0.05 % ophthalmic solution, Place 2 drops into both eyes 2 (two) times daily., Disp: , Rfl:  .  budesonide-formoterol (SYMBICORT) 160-4.5 MCG/ACT inhaler, Inhale 2 puffs into the lungs 2 (two) times daily., Disp: 1 Inhaler, Rfl: 2 .  EPIPEN 2-PAK 0.3 MG/0.3ML SOAJ injection, Inject 0.3 mLs into the skin once as needed for anaphylaxis., Disp: , Rfl: 1 .  fluticasone  (FLONASE) 50 MCG/ACT nasal spray, Place 2 sprays into both nostrils daily., Disp: 16 g, Rfl: 2 .  ketoconazole (NIZORAL) 2 % shampoo, SHAMPOO SCALP AND FACE (LEAVE ON FOR FIVE MINUTES, THEN RINSE) 2 -3 X WEEKLY, Disp: , Rfl: 10 .  loratadine (CLARITIN) 10 MG tablet, Take 1 tablet (10 mg total) by mouth daily., Disp: 30 tablet, Rfl: 0 .  medroxyPROGESTERone Acetate 150 MG/ML SUSY, Inject 1 mL (150 mg total) into the muscle every 3 (three) months., Disp: 1 Syringe, Rfl: 3 .  montelukast (SINGULAIR) 10 MG tablet, Take 1 tablet (10 mg total) by mouth at bedtime., Disp: 90 tablet, Rfl: 1 .  ranitidine (ZANTAC) 150 MG tablet, TAKE 1 TABLET (150 MG TOTAL) BY MOUTH 2 (TWO) TIMES DAILY AS NEEDED FOR HEARTBURN., Disp: 90 tablet, Rfl: 0 .  triamcinolone (KENALOG) 0.025 % cream, Apply 1 application topically 2 (two) times daily., Disp: 30 g, Rfl: 0  Current Facility-Administered Medications:  .  medroxyPROGESTERone Acetate SUSY 150 mg, 150 mg, Intramuscular, Q90 days, Steele Sizer, MD, 150 mg at 10/20/17 1026  Allergies  Allergen Reactions  . Peanut Oil   . Peanut-Containing Drug Products Itching and Other (See Comments)    Throat swelling and becomes itchy     I personally reviewed active problem list, medication list, allergies, lab results with the patient/caregiver today.  ROS  Ten systems reviewed and is negative except as mentioned in HPI.  Objective  Vitals:   03/28/18 1358  BP:  110/70  Pulse: 100  Resp: 18  Temp: 97.6 F (36.4 C)  TempSrc: Oral  SpO2: 99%  Weight: 161 lb 9.6 oz (73.3 kg)  Height: 5\' 1"  (1.549 m)   Body mass index is 30.53 kg/m.  Nursing Note and Vital Signs reviewed.  Physical Exam  Constitutional: Patient appears well-developed and well-nourished. No distress.  HENT: Head: Normocephalic and atraumatic. Ears: bilateral TMs with no erythema or effusion; Nose: Nose normal. Mouth/Throat: Oropharynx is clear and moist. No oropharyngeal exudate or tonsillar  swelling.  Eyes: Conjunctivae and EOM are normal. No scleral icterus.  Pupils are equal, round, and reactive to light.  Neck: Normal range of motion. Neck supple. No JVD present. No thyromegaly present.  Cardiovascular: Normal rate, regular rhythm and normal heart sounds.  No murmur heard. No BLE edema. Pulmonary/Chest: Effort normal and breath sounds normal. No respiratory distress. Abdominal: Soft. Bowel sounds are normal, no distension. There is no tenderness. No masses. Musculoskeletal: Normal range of motion, no joint effusions. No gross deformities Neurological: Pt is alert and oriented to person, place, and time. No cranial nerve deficit. Coordination, balance, strength, speech and gait are normal.  Skin: Skin is warm and dry. No rash noted. No erythema.  Psychiatric: Patient has a normal mood and affect. behavior is normal. Judgment and thought content normal.  No results found for this or any previous visit (from the past 72 hour(s)).  Assessment & Plan  1. Lightheadedness - Drink plenty of water, eat regular meals, will check labs.  If worsening or near-syncope, will present to ER. - Orthostatic vital signs - CBC w/Diff/Platelet - Vitamin B12 - COMPLETE METABOLIC PANEL WITH GFR - TSH  2. On Depo-Provera for contraception - Stable, no vaginal bleeding.  3. Low serum vitamin B12 - Vitamin B12  4. Nonintractable episodic headache, unspecified headache type - CBC w/Diff/Platelet - Vitamin B12 - COMPLETE METABOLIC PANEL WITH GFR - TSH - Advised to take Tylenol and/or Ibuprofen PRN for headaches, and keep headache log, follow up in 2 weeks, sooner if worsening or not improving.  -Red flags and when to present for emergency care or RTC including fever >101.61F, chest pain, shortness of breath, new/worsening/un-resolving symptoms, near-syncope reviewed with patient at time of visit. Follow up and care instructions discussed and provided in AVS.

## 2018-03-29 LAB — CBC WITH DIFFERENTIAL/PLATELET
BASOS ABS: 38 {cells}/uL (ref 0–200)
Basophils Relative: 0.5 %
EOS ABS: 143 {cells}/uL (ref 15–500)
Eosinophils Relative: 1.9 %
HEMATOCRIT: 38 % (ref 35.0–45.0)
Hemoglobin: 12.5 g/dL (ref 11.7–15.5)
LYMPHS ABS: 3053 {cells}/uL (ref 850–3900)
MCH: 25.8 pg — AB (ref 27.0–33.0)
MCHC: 32.9 g/dL (ref 32.0–36.0)
MCV: 78.4 fL — AB (ref 80.0–100.0)
MPV: 8.9 fL (ref 7.5–12.5)
Monocytes Relative: 8.4 %
NEUTROS PCT: 48.5 %
Neutro Abs: 3638 cells/uL (ref 1500–7800)
PLATELETS: 393 10*3/uL (ref 140–400)
RBC: 4.85 10*6/uL (ref 3.80–5.10)
RDW: 13.2 % (ref 11.0–15.0)
TOTAL LYMPHOCYTE: 40.7 %
WBC: 7.5 10*3/uL (ref 3.8–10.8)
WBCMIX: 630 {cells}/uL (ref 200–950)

## 2018-03-29 LAB — COMPLETE METABOLIC PANEL WITH GFR
AG RATIO: 1.5 (calc) (ref 1.0–2.5)
ALBUMIN MSPROF: 4.4 g/dL (ref 3.6–5.1)
ALKALINE PHOSPHATASE (APISO): 66 U/L (ref 33–115)
ALT: 9 U/L (ref 6–29)
AST: 15 U/L (ref 10–30)
BUN: 16 mg/dL (ref 7–25)
CHLORIDE: 104 mmol/L (ref 98–110)
CO2: 28 mmol/L (ref 20–32)
Calcium: 9.7 mg/dL (ref 8.6–10.2)
Creat: 0.87 mg/dL (ref 0.50–1.10)
GFR, Est African American: 108 mL/min/{1.73_m2} (ref >=60)
GFR, Est Non African American: 93 mL/min/{1.73_m2} (ref >=60)
Globulin: 2.9 g/dL (calc) (ref 1.9–3.7)
Glucose, Bld: 77 mg/dL (ref 65–139)
POTASSIUM: 4.1 mmol/L (ref 3.5–5.3)
Sodium: 139 mmol/L (ref 135–146)
Total Bilirubin: 0.3 mg/dL (ref 0.2–1.2)
Total Protein: 7.3 g/dL (ref 6.1–8.1)

## 2018-03-29 LAB — VITAMIN B12: VITAMIN B 12: 468 pg/mL (ref 200–1100)

## 2018-03-29 LAB — TSH: TSH: 0.86 mIU/L

## 2018-04-11 ENCOUNTER — Ambulatory Visit: Payer: 59 | Admitting: Family Medicine

## 2018-04-19 ENCOUNTER — Ambulatory Visit: Payer: 59 | Admitting: Family Medicine

## 2018-04-28 ENCOUNTER — Ambulatory Visit: Payer: 59

## 2018-04-28 ENCOUNTER — Ambulatory Visit (INDEPENDENT_AMBULATORY_CARE_PROVIDER_SITE_OTHER): Payer: 59

## 2018-04-28 DIAGNOSIS — Z3042 Encounter for surveillance of injectable contraceptive: Secondary | ICD-10-CM

## 2018-04-28 MED ORDER — MEDROXYPROGESTERONE ACETATE 150 MG/ML IM SUSP
150.0000 mg | Freq: Once | INTRAMUSCULAR | Status: AC
  Administered 2018-04-28: 150 mg via INTRAMUSCULAR

## 2018-04-28 MED ORDER — MEDROXYPROGESTERONE ACETATE 150 MG/ML IM SUSP: 150.0000 mg | Freq: Once | INTRAMUSCULAR | Status: DC

## 2018-05-24 ENCOUNTER — Other Ambulatory Visit: Payer: Self-pay | Admitting: Family Medicine

## 2018-05-24 NOTE — Telephone Encounter (Signed)
Refill request for general medication: Epipen   Last office visit: 03/28/2018  Last physical exam: 07/08/2016  Follow-ups on file. 07/26/2018

## 2018-05-24 NOTE — Telephone Encounter (Signed)
Copied from [place] [id number]. Topic: Quick Communication - Rx Refill/Question >> May 24, 2018 11:53 AM Yvette Rack wrote: Medication: EPIPEN 2-PAK 0.3 MG/0.3ML SOAJ injection  Has the patient contacted their pharmacy? Yes.  Saturday or Sunday (Agent: If no, request that the patient contact the pharmacy for the refill.) (Agent: If yes, when and what did the pharmacy advise?) would sent a request to provider  Preferred Pharmacy (with phone number or street name): [id number] - [place], [place] [address] [phone] (Phone) [phone] (Fax)    Agent: Please be advised that RX refills may take up to 3 business days. We ask that you follow-up with your pharmacy.

## 2018-05-24 NOTE — Telephone Encounter (Signed)
Pt seen at [facility]; will route to office for final disposition.

## 2018-05-24 NOTE — Telephone Encounter (Signed)
This has already been reordered.

## 2018-07-26 ENCOUNTER — Encounter: Payer: Self-pay | Admitting: Family Medicine

## 2018-07-26 ENCOUNTER — Ambulatory Visit (INDEPENDENT_AMBULATORY_CARE_PROVIDER_SITE_OTHER): Payer: 59 | Admitting: Family Medicine

## 2018-07-26 VITALS — BP 110/84 | HR 102 | Temp 98.5°F | Resp 12 | Ht 61.0 in | Wt 167.5 lb

## 2018-07-26 DIAGNOSIS — R000 Tachycardia, unspecified: Secondary | ICD-10-CM | POA: Diagnosis not present

## 2018-07-26 DIAGNOSIS — R42 Dizziness and giddiness: Secondary | ICD-10-CM | POA: Diagnosis not present

## 2018-07-26 DIAGNOSIS — E6609 Other obesity due to excess calories: Secondary | ICD-10-CM

## 2018-07-26 DIAGNOSIS — J4521 Mild intermittent asthma with (acute) exacerbation: Secondary | ICD-10-CM

## 2018-07-26 DIAGNOSIS — E559 Vitamin D deficiency, unspecified: Secondary | ICD-10-CM | POA: Diagnosis not present

## 2018-07-26 DIAGNOSIS — N944 Primary dysmenorrhea: Secondary | ICD-10-CM | POA: Diagnosis not present

## 2018-07-26 DIAGNOSIS — Z3042 Encounter for surveillance of injectable contraceptive: Secondary | ICD-10-CM | POA: Diagnosis not present

## 2018-07-26 DIAGNOSIS — J302 Other seasonal allergic rhinitis: Secondary | ICD-10-CM

## 2018-07-26 DIAGNOSIS — J3089 Other allergic rhinitis: Secondary | ICD-10-CM

## 2018-07-26 DIAGNOSIS — Z6831 Body mass index (BMI) 31.0-31.9, adult: Secondary | ICD-10-CM

## 2018-07-26 MED ORDER — LEVOCETIRIZINE DIHYDROCHLORIDE 5 MG PO TABS: 5.0000 mg | ORAL_TABLET | Freq: Every evening | ORAL | 1 refills | Status: AC

## 2018-07-26 MED ORDER — ALBUTEROL SULFATE (2.5 MG/3ML) 0.083% IN NEBU: 2.5000 mg | INHALATION_SOLUTION | Freq: Once | RESPIRATORY_TRACT | Status: DC

## 2018-07-26 MED ORDER — FAMOTIDINE 20 MG PO TABS: 20.0000 mg | ORAL_TABLET | Freq: Two times a day (BID) | ORAL | 1 refills | Status: DC

## 2018-07-26 NOTE — Patient Instructions (Signed)
Diabetes Mellitus and Nutrition, Adult  When you have diabetes (diabetes mellitus), it is very important to have healthy eating habits because your blood sugar (glucose) levels are greatly affected by what you eat and drink. Eating healthy foods in the appropriate amounts, at about the same times every day, can help you:  · Control your blood glucose.  · Lower your risk of heart disease.  · Improve your blood pressure.  · Reach or maintain a healthy weight.  Every person with diabetes is different, and each person has different needs for a meal plan. Your health care provider may recommend that you work with a diet and nutrition specialist (dietitian) to make a meal plan that is best for you. Your meal plan may vary depending on factors such as:  · The calories you need.  · The medicines you take.  · Your weight.  · Your blood glucose, blood pressure, and cholesterol levels.  · Your activity level.  · Other health conditions you have, such as heart or kidney disease.  How do carbohydrates affect me?  Carbohydrates, also called carbs, affect your blood glucose level more than any other type of food. Eating carbs naturally raises the amount of glucose in your blood. Carb counting is a method for keeping track of how many carbs you eat. Counting carbs is important to keep your blood glucose at a healthy level, especially if you use insulin or take certain oral diabetes medicines.  It is important to know how many carbs you can safely have in each meal. This is different for every person. Your dietitian can help you calculate how many carbs you should have at each meal and for each snack.  Foods that contain carbs include:  · Bread, cereal, rice, pasta, and crackers.  · Potatoes and corn.  · Peas, beans, and lentils.  · Milk and yogurt.  · Fruit and juice.  · Desserts, such as cakes, cookies, ice cream, and candy.  How does alcohol affect me?  Alcohol can cause a sudden decrease in blood glucose (hypoglycemia),  especially if you use insulin or take certain oral diabetes medicines. Hypoglycemia can be a life-threatening condition. Symptoms of hypoglycemia (sleepiness, dizziness, and confusion) are similar to symptoms of having too much alcohol.  If your health care provider says that alcohol is safe for you, follow these guidelines:  · Limit alcohol intake to no more than 1 drink per day for nonpregnant women and 2 drinks per day for men. One drink equals 12 oz of beer, 5 oz of wine, or 1½ oz of hard liquor.  · Do not drink on an empty stomach.  · Keep yourself hydrated with water, diet soda, or unsweetened iced tea.  · Keep in mind that regular soda, juice, and other mixers may contain a lot of sugar and must be counted as carbs.  What are tips for following this plan?    Reading food labels  · Start by checking the serving size on the "Nutrition Facts" label of packaged foods and drinks. The amount of calories, carbs, fats, and other nutrients listed on the label is based on one serving of the item. Many items contain more than one serving per package.  · Check the total grams (g) of carbs in one serving. You can calculate the number of servings of carbs in one serving by dividing the total carbs by 15. For example, if a food has 30 g of total carbs, it would be equal to 2   servings of carbs.  · Check the number of grams (g) of saturated and trans fats in one serving. Choose foods that have low or no amount of these fats.  · Check the number of milligrams (mg) of salt (sodium) in one serving. Most people should limit total sodium intake to less than 2,300 mg per day.  · Always check the nutrition information of foods labeled as "low-fat" or "nonfat". These foods may be higher in added sugar or refined carbs and should be avoided.  · Talk to your dietitian to identify your daily goals for nutrients listed on the label.  Shopping  · Avoid buying canned, premade, or processed foods. These foods tend to be high in fat, sodium,  and added sugar.  · Shop around the outside edge of the grocery store. This includes fresh fruits and vegetables, bulk grains, fresh meats, and fresh dairy.  Cooking  · Use low-heat cooking methods, such as baking, instead of high-heat cooking methods like deep frying.  · Cook using healthy oils, such as olive, canola, or sunflower oil.  · Avoid cooking with butter, cream, or high-fat meats.  Meal planning  · Eat meals and snacks regularly, preferably at the same times every day. Avoid going long periods of time without eating.  · Eat foods high in fiber, such as fresh fruits, vegetables, beans, and whole grains. Talk to your dietitian about how many servings of carbs you can eat at each meal.  · Eat 4-6 ounces (oz) of lean protein each day, such as lean meat, chicken, fish, eggs, or tofu. One oz of lean protein is equal to:  ? 1 oz of meat, chicken, or fish.  ? 1 egg.  ? ¼ cup of tofu.  · Eat some foods each day that contain healthy fats, such as avocado, nuts, seeds, and fish.  Lifestyle  · Check your blood glucose regularly.  · Exercise regularly as told by your health care provider. This may include:  ? 150 minutes of moderate-intensity or vigorous-intensity exercise each week. This could be brisk walking, biking, or water aerobics.  ? Stretching and doing strength exercises, such as yoga or weightlifting, at least 2 times a week.  · Take medicines as told by your health care provider.  · Do not use any products that contain nicotine or tobacco, such as cigarettes and e-cigarettes. If you need help quitting, ask your health care provider.  · Work with a counselor or diabetes educator to identify strategies to manage stress and any emotional and social challenges.  Questions to ask a health care provider  · Do I need to meet with a diabetes educator?  · Do I need to meet with a dietitian?  · What number can I call if I have questions?  · When are the best times to check my blood glucose?  Where to find more  information:  · American Diabetes Association: diabetes.org  · Academy of Nutrition and Dietetics: www.eatright.org  · National Institute of Diabetes and Digestive and Kidney Diseases (NIH): www.niddk.nih.gov  Summary  · A healthy meal plan will help you control your blood glucose and maintain a healthy lifestyle.  · Working with a diet and nutrition specialist (dietitian) can help you make a meal plan that is best for you.  · Keep in mind that carbohydrates (carbs) and alcohol have immediate effects on your blood glucose levels. It is important to count carbs and to use alcohol carefully.  This information is not intended to   replace advice given to you by your health care provider. Make sure you discuss any questions you have with your health care provider.  Document Released: 02/05/2005 Document Revised: 12/09/2016 Document Reviewed: 06/15/2016  Elsevier Interactive Patient Education © 2019 Elsevier Inc.

## 2018-07-26 NOTE — Progress Notes (Signed)
Name: Shelley Davis   MRN: [id number]    DOB: June 05, 1995   Date:07/26/2018       Progress Note  Subjective  Chief Complaint  Chief Complaint  Patient presents with  . Contraception  . Dizziness    HPI  Pt presents with concern for dizziness - started 07/21/2018, worsened for several days, then started to improve yesterday.  She denies room-spinning dizziness, but more lightheadedness.  She was seen 03/28/2018 for lightheadedness, which showed a very mild decrease in [facility] and [facility].  She has not had any syncope.   - Does get headaches, but they are not associated with her lightheadedness.  -  She has a lot of allergies, but stopped shots due to cost.   - She does have asthma - taking singulair; was on zantac but stopped several months ago.  Has been on claritin for several years. Also using flonase.  - No blood in stool or dark and tarry stool - does endorse intermittent constipation.  Patient Active Problem List   Diagnosis Date Noted  . On Depo-Provera for contraception 03/28/2018  . Asthma, mild intermittent 08/05/2016  . Low serum vitamin B12 08/05/2016  . Primary dysmenorrhea 07/08/2016  . Perennial allergic rhinitis with seasonal variation 12/05/2015  . Allergic to peanuts 12/05/2015  . Vitamin D deficiency 12/05/2015  . Eczema 12/05/2015    Social History   Tobacco Use  . Smoking status: Never Smoker  . Smokeless tobacco: Never Used  Substance Use Topics  . Alcohol use: Yes    Comment: occasionally     Current Outpatient Medications:  .  albuterol (PROAIR HFA) 108 (90 Base) MCG/ACT inhaler, Inhale 2 puffs into the lungs every 6 (six) hours as needed for wheezing or shortness of breath., Disp: 1 Inhaler, Rfl: 0 .  budesonide-formoterol (SYMBICORT) 160-4.5 MCG/ACT inhaler, Inhale 2 puffs into the lungs 2 (two) times daily., Disp: 1 Inhaler, Rfl: 2 .  EPINEPHrine 0.3 mg/0.3 mL IJ SOAJ injection, INJECT 0.3 MLS (0.3 MG TOTAL) INTO THE MUSCLE ONCE., Disp: 2 mL, Rfl: 1 .   fluticasone (FLONASE) 50 MCG/ACT nasal spray, Place 2 sprays into both nostrils daily., Disp: 16 g, Rfl: 2 .  ketoconazole (NIZORAL) 2 % shampoo, SHAMPOO SCALP AND FACE (LEAVE ON FOR FIVE MINUTES, THEN RINSE) 2 -3 X WEEKLY, Disp: , Rfl: 10 .  loratadine (CLARITIN) 10 MG tablet, Take 1 tablet (10 mg total) by mouth daily., Disp: 30 tablet, Rfl: 0 .  medroxyPROGESTERone Acetate 150 MG/ML SUSY, Inject 1 mL (150 mg total) into the muscle every 3 (three) months., Disp: 1 Syringe, Rfl: 3 .  montelukast (SINGULAIR) 10 MG tablet, Take 1 tablet (10 mg total) by mouth at bedtime., Disp: 90 tablet, Rfl: 1 .  ranitidine (ZANTAC) 150 MG tablet, TAKE 1 TABLET (150 MG TOTAL) BY MOUTH 2 (TWO) TIMES DAILY AS NEEDED FOR HEARTBURN., Disp: 90 tablet, Rfl: 0 .  triamcinolone (KENALOG) 0.025 % cream, Apply 1 application topically 2 (two) times daily., Disp: 30 g, Rfl: 0 .  azelastine (OPTIVAR) 0.05 % ophthalmic solution, Place 2 drops into both eyes 2 (two) times daily., Disp: , Rfl:   Current Facility-Administered Medications:  .  medroxyPROGESTERone Acetate SUSY 150 mg, 150 mg, Intramuscular, Q90 days, Steele Sizer, MD, 150 mg at 10/20/17 1026  Allergies  Allergen Reactions  . Peanut Oil   . Peanut-Containing Drug Products Itching and Other (See Comments)    Throat swelling and becomes itchy     I personally reviewed active  problem list, medication list, allergies, notes from last encounter, lab results with the patient/caregiver today.  ROS  Ten systems reviewed and is negative except as mentioned in HPI  Objective  Vitals:   07/26/18 1413  BP: 120/80  Pulse: (!) 105  Resp: 12  Temp: 98.5 F (36.9 C)  TempSrc: Oral  SpO2: 98%  Weight: 167 lb 8 oz (76 kg)  Height: 5\' 1"  (1.549 m)   Body mass index is 31.65 kg/m.  Nursing Note and Vital Signs reviewed.  Physical Exam  Constitutional: Patient appears well-developed and well-nourished. No distress.  HENT: Head: Normocephalic and  atraumatic. Ears: bilateral TMs with no erythema or effusion; Nose: Nose normal. Mouth/Throat: Oropharynx is clear and moist. No oropharyngeal exudate or tonsillar swelling.  Eyes: Conjunctivae and EOM are normal. No scleral icterus.  Pupils are equal, round, and reactive to light.  Neck: Normal range of motion. Neck supple. No JVD present. No thyromegaly present.  Cardiovascular: Normal rate, regular rhythm and normal heart sounds.  No murmur heard. No BLE edema. Pulmonary/Chest: Effort normal and breath sounds normal. No respiratory distress. Abdominal: Soft. Bowel sounds are normal, no distension. There is no tenderness. No masses. Musculoskeletal: Normal range of motion, no joint effusions. No gross deformities Neurological: Pt is alert and oriented to person, place, and time. No cranial nerve deficit. Coordination, balance, strength, speech and gait are normal.  Skin: Skin is warm and dry. No rash noted. No erythema.  Psychiatric: Patient has a normal mood and affect. behavior is normal. Judgment and thought content normal.  No results found for this or any previous visit (from the past 72 hour(s)).  Assessment & Plan  1. Lightheadedness - We will await labs, trial of Xyzal and pepcid for allergy control.  If no improvement and labs benign, will consider referral to neurology vs cardiology (?tachycardia may be contributing) - CBC w/Diff/Platelet - Iron, TIBC and Ferritin Panel - BASIC METABOLIC PANEL WITH GFR - Insulin, random - Orthostatic vital signs - EKG 12-Lead - NSR  2. Mild intermittent asthma with acute exacerbation - levocetirizine (XYZAL) 5 MG tablet; Take 1 tablet (5 mg total) by mouth every evening.  Dispense: 90 tablet; Refill: 1 - Spirometry with graph; Future - unable to perform due to malfunction in equipment, will obtain at follow up.  3. Perennial allergic rhinitis with seasonal variation - levocetirizine (XYZAL) 5 MG tablet; Take 1 tablet (5 mg total) by mouth  every evening.  Dispense: 90 tablet; Refill: 1 - famotidine (PEPCID) 20 MG tablet; Take 1 tablet (20 mg total) by mouth 2 (two) times daily.  Dispense: 180 tablet; Refill: 1  4. Vitamin D deficiency - VITAMIN D 25 Hydroxy (Vit-D Deficiency, Fractures)  5. Class 1 obesity due to excess calories without serious comorbidity with body mass index (BMI) of 31.0 to 31.9 in adult - Insulin, random  6. On Depo-Provera for contraception - Shot given in office today  7. Tachycardia - EKG 12-Lead  -Red flags and when to present for emergency care or RTC including fever >101.78F, chest pain, shortness of breath, new/worsening/un-resolving symptoms,syncope/near-syncope reviewed with patient at time of visit. Follow up and care instructions discussed and provided in AVS.

## 2018-07-27 ENCOUNTER — Other Ambulatory Visit: Payer: Self-pay | Admitting: Family Medicine

## 2018-07-27 ENCOUNTER — Telehealth: Payer: Self-pay | Admitting: Family Medicine

## 2018-07-27 DIAGNOSIS — E559 Vitamin D deficiency, unspecified: Secondary | ICD-10-CM

## 2018-07-27 DIAGNOSIS — E611 Iron deficiency: Secondary | ICD-10-CM

## 2018-07-27 LAB — CBC WITH DIFFERENTIAL/PLATELET
Absolute Monocytes: 581 cells/uL (ref 200–950)
Basophils Absolute: 28 cells/uL (ref 0–200)
Basophils Relative: 0.4 %
Eosinophils Absolute: 147 cells/uL (ref 15–500)
Eosinophils Relative: 2.1 %
HCT: 37.4 % (ref 35.0–45.0)
Hemoglobin: 12.3 g/dL (ref 11.7–15.5)
LYMPHS ABS: 2877 {cells}/uL (ref 850–3900)
MCH: 25.7 pg — ABNORMAL LOW (ref 27.0–33.0)
MCHC: 32.9 g/dL (ref 32.0–36.0)
MCV: 78.1 fL — ABNORMAL LOW (ref 80.0–100.0)
MPV: 8.8 fL (ref 7.5–12.5)
Monocytes Relative: 8.3 %
Neutro Abs: 3367 cells/uL (ref 1500–7800)
Neutrophils Relative %: 48.1 %
Platelets: 368 10*3/uL (ref 140–400)
RBC: 4.79 10*6/uL (ref 3.80–5.10)
RDW: 13.2 % (ref 11.0–15.0)
Total Lymphocyte: 41.1 %
WBC: 7 10*3/uL (ref 3.8–10.8)

## 2018-07-27 LAB — BASIC METABOLIC PANEL WITH GFR
BUN: 16 mg/dL (ref 7–25)
CO2: 28 mmol/L (ref 20–32)
Calcium: 9.7 mg/dL (ref 8.6–10.2)
Chloride: 105 mmol/L (ref 98–110)
Creat: 0.94 mg/dL (ref 0.50–1.10)
GFR, Est African American: 98 mL/min/{1.73_m2} (ref >=60)
GFR, Est Non African American: 85 mL/min/{1.73_m2} (ref >=60)
Glucose, Bld: 93 mg/dL (ref 65–99)
Potassium: 4.3 mmol/L (ref 3.5–5.3)
Sodium: 140 mmol/L (ref 135–146)

## 2018-07-27 LAB — INSULIN, RANDOM: Insulin: 17.6 u[IU]/mL

## 2018-07-27 LAB — IRON,TIBC AND FERRITIN PANEL
%SAT: 13 % (calc) — ABNORMAL LOW (ref 16–45)
Ferritin: 95 ng/mL (ref 16–154)
Iron: 39 ug/dL — ABNORMAL LOW (ref 40–190)
TIBC: 300 mcg/dL (calc) (ref 250–450)

## 2018-07-27 LAB — VITAMIN D 25 HYDROXY (VIT D DEFICIENCY, FRACTURES): Vit D, 25-Hydroxy: 14 ng/mL — ABNORMAL LOW (ref 30–100)

## 2018-07-27 MED ORDER — VITAMIN D (ERGOCALCIFEROL) 1.25 MG (50000 UNIT) PO CAPS: 50000.0000 [IU] | ORAL_CAPSULE | ORAL | 0 refills | Status: DC

## 2018-07-27 NOTE — Telephone Encounter (Signed)
Erroneous

## 2018-08-04 ENCOUNTER — Telehealth: Payer: Self-pay | Admitting: Family Medicine

## 2018-08-04 NOTE — Telephone Encounter (Signed)
Copied from [place] [id number]. Topic: Quick Communication - See Telephone Encounter >> Aug 04, 2018  5:03 PM Valla Leaver wrote: CRM for notification. See Telephone encounter for: 08/04/18. Patient would like a call back to discuss what iron to take per NP Raelyn Ensign recommendation 07/26/18?

## 2018-08-04 NOTE — Telephone Encounter (Signed)
Spoke with patient and informed her that per Raquel Sarna she is to take 325mg  iron daily over the counter supplement.  Patient verbalized understanding.

## 2018-08-17 ENCOUNTER — Telehealth: Payer: Self-pay

## 2018-08-17 ENCOUNTER — Encounter: Payer: Self-pay | Admitting: Family Medicine

## 2018-08-17 NOTE — Telephone Encounter (Signed)
Copied from [place] [phone]. Topic: General - Other >> Aug 17, 2018  3:00 PM Keene Breath wrote: Reason for CRM: Patient called to request a letter from the doctor for her employer stating that she is at high risk for the COVID 19 because of her asthma.  Patient said she does not have symptoms, but employer requires a letter to be out of work.  Informed patient that because she does not have symptoms, it might not be possible to get the letter she needs.  Patient understood and would still like a call back.  Please advise and call patient back at [phone]

## 2018-08-18 ENCOUNTER — Other Ambulatory Visit: Payer: Self-pay | Admitting: Family Medicine

## 2018-08-18 DIAGNOSIS — E559 Vitamin D deficiency, unspecified: Secondary | ICD-10-CM

## 2018-08-18 NOTE — Telephone Encounter (Signed)
Tried calling pt to inform her about her letter to go back to work. Could not leave a message due to mailbox being full. From the earlier message from the [facility] pt is requesting a call back.

## 2018-08-19 NOTE — Telephone Encounter (Signed)
Tried to call patient in regards to letter. NO answer, unable to leave VM, mailbox full.

## 2018-08-26 ENCOUNTER — Ambulatory Visit (INDEPENDENT_AMBULATORY_CARE_PROVIDER_SITE_OTHER): Payer: 59 | Admitting: Family Medicine

## 2018-08-26 ENCOUNTER — Encounter: Payer: Self-pay | Admitting: Family Medicine

## 2018-08-26 DIAGNOSIS — L210 Seborrhea capitis: Secondary | ICD-10-CM | POA: Diagnosis not present

## 2018-08-26 DIAGNOSIS — J302 Other seasonal allergic rhinitis: Secondary | ICD-10-CM

## 2018-08-26 DIAGNOSIS — J3089 Other allergic rhinitis: Secondary | ICD-10-CM | POA: Diagnosis not present

## 2018-08-26 DIAGNOSIS — R42 Dizziness and giddiness: Secondary | ICD-10-CM

## 2018-08-26 DIAGNOSIS — J4520 Mild intermittent asthma, uncomplicated: Secondary | ICD-10-CM | POA: Diagnosis not present

## 2018-08-26 MED ORDER — ALBUTEROL SULFATE HFA 108 (90 BASE) MCG/ACT IN AERS: 2.0000 | INHALATION_SPRAY | Freq: Four times a day (QID) | RESPIRATORY_TRACT | 0 refills | Status: DC | PRN

## 2018-08-26 MED ORDER — MONTELUKAST SODIUM 10 MG PO TABS: 10.0000 mg | ORAL_TABLET | Freq: Every day | ORAL | 1 refills | Status: DC

## 2018-08-26 MED ORDER — KETOCONAZOLE 2 % EX SHAM: MEDICATED_SHAMPOO | CUTANEOUS | 2 refills | Status: DC

## 2018-08-26 NOTE — Progress Notes (Signed)
Name: Shelley Davis   MRN: [id number]    DOB: [birth year]   Date:08/26/2018       Progress Note  Subjective  Chief Complaint  Chief Complaint  Patient presents with  . Follow-up    1 month F/U-Needs refill on shampoo  . Dizziness    Has improved but states she still has episodes in the daytime  . Anemia    Has been having stomach cramps and constipation with Iron tablets.    I connected with@ on 08/26/18 at  9:40 AM EDT by a video enabled telemedicine application and verified that I am speaking with the correct person using two identifiers.  I discussed the limitations of evaluation and management by telemedicine and the availability of in person appointments. The patient expressed understanding and agreed to proceed. Staff also discussed with the patient that there may be a patient responsible charge related to this service. Patient Location: at work  Provider Location: [facility]   HPI  Dizziness: she was seen by Raelyn Ensign NP one month ago, had labs done, she states episodes were daily and lasting hours, now only about once a week and lasting at most one hour. No spinning or associated headache, usually happens when sitting, at times when walking, not related to head movement or position changes. She states she was very stressed about her bills when she was seen by Raquel Sarna but now her financial situation is better. Explained that it was likely secondary to stress  Asthma mild intermittent: she denies wheezing or cough, states some SOB when rushing to get to work but otherwise not problems. She stopped Symbicort and would like refills of singulair and Proair because of allergy season  AR: symptoms at this time includes itchy eyes, but no nasal congestion or rhinorrhea  Seborrhea: she would like a refill of medication, lost of white spots and itching  GERD: denies heart burn or indigestion, taking pepcid once a day, advised to try changing life style and take it prn  only   Patient Active Problem List   Diagnosis Date Noted  . Class 1 obesity due to excess calories without serious comorbidity with body mass index (BMI) of 31.0 to 31.9 in adult 07/26/2018  . Lightheadedness 07/26/2018  . On Depo-Provera for contraception 03/28/2018  . Asthma, mild intermittent 08/05/2016  . Low serum vitamin B12 08/05/2016  . Primary dysmenorrhea 07/08/2016  . Perennial allergic rhinitis with seasonal variation 12/05/2015  . Allergic to peanuts 12/05/2015  . Vitamin D deficiency 12/05/2015  . Eczema 12/05/2015    Past Surgical History:  Procedure Laterality Date  . WISDOM TOOTH EXTRACTION Bilateral     Family History  Problem Relation Age of Onset  . Hypertension Mother   . Asthma Sister   . Lung disease Maternal Grandmother   . Migraines Sister     Social History   Socioeconomic History  . Marital status: Single    Spouse name: Not on file  . Number of children: 0  . Years of education: 41  . Highest education level: High school graduate  Occupational History  . Occupation: Office manager parts at work     Fish farm manager: [employer]  Social Needs  . Financial resource strain: Not hard at all  . Food insecurity:    Worry: Never true    Inability: Never true  . Transportation needs:    Medical: No    Non-medical: No  Tobacco Use  . Smoking status: Never Smoker  . Smokeless tobacco:  Never Used  Substance and Sexual Activity  . Alcohol use: Yes    Comment: occasionally  . Drug use: No  . Sexual activity: Yes    Partners: Male    Birth control/protection: Injection  Lifestyle  . Physical activity:    Days per week: 0 days    Minutes per session: 0 min  . Stress: Not at all  Relationships  . Social connections:    Talks on phone: More than three times a week    Gets together: Three times a week    Attends religious service: Never    Active member of club or organization: No    Attends meetings of clubs or organizations: Never    Relationship status:  Never married  . Intimate partner violence:    Fear of current or ex partner: No    Emotionally abused: No    Physically abused: No    Forced sexual activity: No  Other Topics Concern  . 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  albuterol (PROAIR HFA) 108 (90 Base) MCG/ACT inhaler, Inhale 2 puffs into the lungs every 6 (six) hours as needed for wheezing or shortness of breath., Disp: 1 Inhaler, Rfl: 0 .  azelastine (OPTIVAR) 0.05 % ophthalmic solution, Place 2 drops into both eyes 2 (two) times daily., Disp: , Rfl:  .  budesonide-formoterol (SYMBICORT) 160-4.5 MCG/ACT inhaler, Inhale 2 puffs into the lungs 2 (two) times daily., Disp: 1 Inhaler, Rfl: 2 .  EPINEPHrine 0.3 mg/0.3 mL IJ SOAJ injection, INJECT 0.3 MLS (0.3 MG TOTAL) INTO THE MUSCLE ONCE., Disp: 2 mL, Rfl: 1 .  famotidine (PEPCID) 20 MG tablet, Take 1 tablet (20 mg total) by mouth 2 (two) times daily., Disp: 180 tablet, Rfl: 1 .  fluticasone (FLONASE) 50 MCG/ACT nasal spray, Place 2 sprays into both nostrils daily., Disp: 16 g, Rfl: 2 .  ketoconazole (NIZORAL) 2 % shampoo, SHAMPOO SCALP AND FACE (LEAVE ON FOR FIVE MINUTES, THEN RINSE) 2 -3 X WEEKLY, Disp: , Rfl: 10 .  levocetirizine (XYZAL) 5 MG tablet, Take 1 tablet (5 mg total) by mouth every evening., Disp: 90 tablet, Rfl: 1 .  medroxyPROGESTERone Acetate 150 MG/ML SUSY, Inject 1 mL (150 mg total) into the muscle every 3 (three) months., Disp: 1 Syringe, Rfl: 3 .  montelukast (SINGULAIR) 10 MG tablet, Take 1 tablet (10 mg total) by mouth at bedtime., Disp: 90 tablet, Rfl: 1 .  Vitamin D, Ergocalciferol, (DRISDOL) 1.25 MG (50000 UT) CAPS capsule, Take 1 capsule (50,000 Units total) by mouth every 7 (seven) days., Disp: 8 capsule, Rfl: 0 .  triamcinolone (KENALOG) 0.025 % cream, Apply 1 application topically 2 (two) times  daily. (Patient not taking: Reported on 08/26/2018), Disp: 30 g, Rfl: 0  Current Facility-Administered Medications:  .  medroxyPROGESTERone Acetate SUSY 150 mg, 150 mg, Intramuscular, Q90 days, Steele Sizer, MD, 150 mg at 07/26/18 1614  Allergies  Allergen Reactions  . Peanut Oil   . Peanut-Containing Drug Products Itching and Other (See Comments)    Throat swelling and becomes itchy     I personally reviewed active problem list, medication list, allergies, family history, social history with the patient/caregiver today.   ROS  Constitutional: Negative for fever or weight change.  Respiratory: Negative for cough but has mild  shortness of  breath with activity, explained likely multifactorial and needs to exercise more   Cardiovascular: Negative for chest pain or palpitations.  Gastrointestinal: Negative for abdominal pain, no bowel changes.  Musculoskeletal: Negative for gait problem or joint swelling.  Skin: Negative for rash.  Neurological: positive  for occasional dizziness but no  headache.  No other specific complaints in a complete review of systems (except as listed in HPI above).  Objective  Virtual encounter, vitals not obtained.  Physical Exam  Awake, alert , no distress, well groomed and cheerful   PHQ2/9: Depression screen [facility] 2/9 08/26/2018 07/26/2018 03/28/2018 02/03/2018 10/20/2017  Decreased Interest 0 0 0 0 0  Down, Depressed, Hopeless 0 0 0 0 0  PHQ - 2 Score 0 0 0 0 0  Altered sleeping 0 0 0 1 2  Tired, decreased energy 0 3 0 1 2  Change in appetite 0 0 0 0 1  Feeling bad or failure about yourself  0 0 0 0 0  Trouble concentrating 0 0 0 0 0  Moving slowly or fidgety/restless 0 0 0 0 0  Suicidal thoughts 0 0 0 0 0  PHQ-9 Score 0 3 0 2 5  Difficult doing work/chores Not difficult at all Not difficult at all Not difficult at all Not difficult at all Somewhat difficult   PHQ-2/9 Result is negative.    Fall Risk: Fall Risk  08/26/2018 07/26/2018 03/28/2018  02/03/2018 10/20/2017  Falls in the past year? 0 0 0 No No  Number falls in past yr: 0 0 0 - -  Injury with Fall? 0 0 - - -     Assessment & Plan  1. Dizziness  Greatly improved, likely secondary to stress, reviewed labs with patient   2. Perennial allergic rhinitis with seasonal variation  - montelukast (SINGULAIR) 10 MG tablet; Take 1 tablet (10 mg total) by mouth at bedtime.  Dispense: 90 tablet; Refill: 1  3. Mild intermittent asthma without complication  - albuterol (PROAIR HFA) 108 (90 Base) MCG/ACT inhaler; Inhale 2 puffs into the lungs every 6 (six) hours as needed for wheezing or shortness of breath.  Dispense: 1 Inhaler; Refill: 0 - montelukast (SINGULAIR) 10 MG tablet; Take 1 tablet (10 mg total) by mouth at bedtime.  Dispense: 90 tablet; Refill: 1  4. Seborrhea capitis in adult  - ketoconazole (NIZORAL) 2 % shampoo; Apply topically 2 (two) times a week.  Dispense: 120 mL; Refill: 2  I discussed the assessment and treatment plan with the patient. The patient was provided an opportunity to ask questions and all were answered. The patient agreed with the plan and demonstrated an understanding of the instructions.  The patient was advised to call back or seek an in-person evaluation if the symptoms worsen or if the condition fails to improve as anticipated.  I provided 25 minutes of non-face-to-face time during this encounter.

## 2018-08-29 ENCOUNTER — Other Ambulatory Visit: Payer: Self-pay | Admitting: Family Medicine

## 2018-08-29 MED ORDER — ALBUTEROL SULFATE HFA 108 (90 BASE) MCG/ACT IN AERS: 2.0000 | INHALATION_SPRAY | Freq: Four times a day (QID) | RESPIRATORY_TRACT | 0 refills | Status: DC | PRN

## 2018-10-16 ENCOUNTER — Other Ambulatory Visit: Payer: Self-pay | Admitting: Family Medicine

## 2018-10-16 DIAGNOSIS — N944 Primary dysmenorrhea: Secondary | ICD-10-CM

## 2018-10-16 DIAGNOSIS — Z3042 Encounter for surveillance of injectable contraceptive: Secondary | ICD-10-CM

## 2018-11-08 ENCOUNTER — Other Ambulatory Visit: Payer: Self-pay

## 2018-11-08 ENCOUNTER — Ambulatory Visit (INDEPENDENT_AMBULATORY_CARE_PROVIDER_SITE_OTHER): Payer: 59 | Admitting: Family Medicine

## 2018-11-08 ENCOUNTER — Other Ambulatory Visit (HOSPITAL_COMMUNITY)
Admission: RE | Admit: 2018-11-08 | Discharge: 2018-11-08 | Disposition: A | Discharge status: Home / Self Care | Payer: 59 | Source: Ambulatory Visit | Attending: Family Medicine | Admitting: Family Medicine

## 2018-11-08 ENCOUNTER — Encounter: Payer: Self-pay | Admitting: Family Medicine

## 2018-11-08 VITALS — BP 118/74 | HR 100 | Temp 97.9°F | Resp 16 | Ht 61.0 in | Wt 171.7 lb

## 2018-11-08 DIAGNOSIS — Z13220 Encounter for screening for lipoid disorders: Secondary | ICD-10-CM

## 2018-11-08 DIAGNOSIS — Z3042 Encounter for surveillance of injectable contraceptive: Secondary | ICD-10-CM

## 2018-11-08 DIAGNOSIS — Z124 Encounter for screening for malignant neoplasm of cervix: Secondary | ICD-10-CM

## 2018-11-08 DIAGNOSIS — Z01419 Encounter for gynecological examination (general) (routine) without abnormal findings: Secondary | ICD-10-CM | POA: Insufficient documentation

## 2018-11-08 DIAGNOSIS — R002 Palpitations: Secondary | ICD-10-CM | POA: Diagnosis not present

## 2018-11-08 DIAGNOSIS — E611 Iron deficiency: Secondary | ICD-10-CM

## 2018-11-08 DIAGNOSIS — E8881 Metabolic syndrome: Secondary | ICD-10-CM

## 2018-11-08 DIAGNOSIS — Z130 Encounter for screening for diseases of the blood and blood-forming organs and certain disorders involving the immune mechanism: Secondary | ICD-10-CM

## 2018-11-08 LAB — POCT URINE PREGNANCY: Preg Test, Ur: NEGATIVE

## 2018-11-08 MED ORDER — MEDROXYPROGESTERONE ACETATE 150 MG/ML IM SUSY
150.0000 mg | PREFILLED_SYRINGE | INTRAMUSCULAR | Status: AC
  Administered 2018-11-08: 150 mg via INTRAMUSCULAR

## 2018-11-08 NOTE — Progress Notes (Signed)
Name: Shelley Davis   MRN: [id number]    DOB: 09-28-1995   Date:11/08/2018       Progress Note  Subjective  Chief Complaint  Chief Complaint  Patient presents with  . Well Woman Exam    HPI   Patient presents for annual CPE and follow up  Palpitation: she states she has episodes of palpitation for a long time ( months ), however is getting more frequent and this past week associated with some SOB and happened back to back. She came in for dizziness in March and EKG showed sinus rhythm but difficulty to see p waves, she does not recall association with exercise, food or caffeine intake. No family history of heart problems of sudden death. She has episodes of dizziness that seems unrelated to palpitation  Obesity: she has gained 20 lbs in the past year, discussed a healthier diet and also a carbohydrate restrictive diet.   Insulin resistance:  Discussed importance of weight loss and physical activity, recheck A1C .  Diet: skips meals, over eats when hungry  Exercise: not currently   USPSTF grade A and B recommendations    Office Visit from 11/08/2018 in [facility]  AUDIT-C Score  1     Depression: Phq 9 is  negative Depression screen [facility] 2/9 11/08/2018 08/26/2018 07/26/2018 03/28/2018 02/03/2018  Decreased Interest 0 0 0 0 0  Down, Depressed, Hopeless 0 0 0 0 0  PHQ - 2 Score 0 0 0 0 0  Altered sleeping 0 0 0 0 1  Tired, decreased energy 0 0 3 0 1  Change in appetite 0 0 0 0 0  Feeling bad or failure about yourself  0 0 0 0 0  Trouble concentrating 0 0 0 0 0  Moving slowly or fidgety/restless 0 0 0 0 0  Suicidal thoughts 0 0 0 0 0  PHQ-9 Score 0 0 3 0 2  Difficult doing work/chores Not difficult at all Not difficult at all Not difficult at all Not difficult at all Not difficult at all   Hypertension: BP Readings from Last 3 Encounters:  11/08/18 118/74  07/26/18 110/84  03/28/18 110/70   Obesity: Wt Readings from Last 3 Encounters:  11/08/18 171 lb 11.2  oz (77.9 kg)  07/26/18 167 lb 8 oz (76 kg)  03/28/18 161 lb 9.6 oz (73.3 kg)   BMI Readings from Last 3 Encounters:  11/08/18 32.44 kg/m  07/26/18 31.65 kg/m  03/28/18 30.53 kg/m    Hep C Screening: last year  STD testing and prevention (HIV/chl/gon/syphilis): had it last year, same partner  Intimate partner violence: negative screen  Sexual History/Pain during Intercourse: sexually active, boyfriend , no pain  Menstrual History/LMP/Abnormal Bleeding: no cycles between depo shots  Incontinence Symptoms: none   Advanced Care Planning: A voluntary discussion about advance care planning including the explanation and discussion of advance directives.  Discussed health care proxy and Living will, and the patient was able to identify a health care proxy as mother   Patient does not have a living will at present time.    BRCA gene screening: N/A Cervical cancer screening: today   Osteoporosis Screening: take vitamin D 2000 units daily and also needs a high calcium diet   Lipids:  Lab Results  Component Value Date   CHOL 184 07/08/2016   Lab Results  Component Value Date   HDL 70 07/08/2016   Lab Results  Component Value Date   LDLCALC 106 (H) 07/08/2016  Lab Results  Component Value Date   TRIG 38 07/08/2016   Lab Results  Component Value Date   CHOLHDL 2.6 07/08/2016   No results found for: LDLDIRECT  Glucose:  Glucose, Bld  Date Value Ref Range Status  07/26/2018 93 65 - 99 mg/dL Final    Comment:    .            Fasting reference interval .   03/28/2018 77 65 - 139 mg/dL Final    Comment:    .        Non-fasting reference interval .   01/14/2017 86 65 - 99 mg/dL Final    Skin cancer: discussed atypical lesions  DYJ:WLKH in the past and P waves very hard to see, continues to have symptoms refer to cardiologist   Patient Active Problem List   Diagnosis Date Noted  . Class 1 obesity due to excess calories without serious comorbidity with body mass  index (BMI) of 31.0 to 31.9 in adult 07/26/2018  . Lightheadedness 07/26/2018  . On Depo-Provera for contraception 03/28/2018  . Asthma, mild intermittent 08/05/2016  . Low serum vitamin B12 08/05/2016  . Primary dysmenorrhea 07/08/2016  . Perennial allergic rhinitis with seasonal variation 12/05/2015  . Allergic to peanuts 12/05/2015  . Vitamin D deficiency 12/05/2015  . Eczema 12/05/2015    Past Surgical History:  Procedure Laterality Date  . WISDOM TOOTH EXTRACTION Bilateral     Family History  Problem Relation Age of Onset  . Hypertension Mother   . Asthma Sister   . Lung disease Maternal Grandmother   . Migraines Sister     Social History   Socioeconomic History  . Marital status: Single    Spouse name: Not on file  . Number of children: 0  . Years of education: 29  . Highest education level: High school graduate  Occupational History  . Occupation: Office manager parts at work     Fish farm manager: [employer]  Social Needs  . Financial resource strain: Not hard at all  . Food insecurity    Worry: Never true    Inability: Never true  . Transportation needs    Medical: No    Non-medical: No  Tobacco Use  . Smoking status: Never Smoker  . Smokeless tobacco: Never Used  Substance and Sexual Activity  . Alcohol use: Yes    Comment: occasionally  . Drug use: No  . Sexual activity: Yes    Partners: Male    Birth control/protection: Injection  Lifestyle  . Physical activity    Days per week: 0 days    Minutes per session: 0 min  . Stress: Not at all  Relationships  . Social connections    Talks on phone: More than three times a week    Gets together: Three times a week    Attends religious service: Never    Active member of club or organization: No    Attends meetings of clubs or organizations: Never    Relationship status: Never married  . Intimate partner violence    Fear of current or ex partner: No    Emotionally abused: No    Physically abused: No    Forced sexual  activity: No  Other Topics Concern  . 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  albuterol (PROVENTIL HFA;VENTOLIN HFA) 108 (90 Base) MCG/ACT inhaler, Inhale 2 puffs into the lungs every 6 (six) hours as needed for wheezing or shortness of breath., Disp: 1 Inhaler, Rfl: 0 .  azelastine (OPTIVAR) 0.05 % ophthalmic solution, Place 2 drops into both eyes 2 (two) times daily., Disp: , Rfl:  .  EPINEPHrine 0.3 mg/0.3 mL IJ SOAJ injection, INJECT 0.3 MLS (0.3 MG TOTAL) INTO THE MUSCLE ONCE., Disp: 2 mL, Rfl: 1 .  famotidine (PEPCID) 20 MG tablet, Take 1 tablet (20 mg total) by mouth 2 (two) times daily., Disp: 180 tablet, Rfl: 1 .  ketoconazole (NIZORAL) 2 % shampoo, Apply topically 2 (two) times a week., Disp: 120 mL, Rfl: 2 .  levocetirizine (XYZAL) 5 MG tablet, Take 1 tablet (5 mg total) by mouth every evening., Disp: 90 tablet, Rfl: 1 .  medroxyPROGESTERone Acetate 150 MG/ML SUSY, INJECT 1 ML (150 MG TOTAL) INTO THE MUSCLE EVERY 3 (THREE) MONTHS., Disp: 1 Syringe, Rfl: 1 .  montelukast (SINGULAIR) 10 MG tablet, Take 1 tablet (10 mg total) by mouth at bedtime., Disp: 90 tablet, Rfl: 1 .  Vitamin D, Ergocalciferol, (DRISDOL) 1.25 MG (50000 UT) CAPS capsule, Take 1 capsule (50,000 Units total) by mouth every 7 (seven) days., Disp: 8 capsule, Rfl: 0  Current Facility-Administered Medications:  .  medroxyPROGESTERone Acetate SUSY 150 mg, 150 mg, Intramuscular, Q90 days, Steele Sizer, MD  Allergies  Allergen Reactions  . Peanut Oil   . Peanut-Containing Drug Products Itching and Other (See Comments)    Throat swelling and becomes itchy      ROS  Constitutional: Negative for fever, positive for  weight change.  Respiratory: Negative for cough and shortness of breath.   Cardiovascular: Negative for chest pain , but has intermittent  palpitations.  Gastrointestinal: Negative for abdominal pain, no bowel changes.  Musculoskeletal: Negative for gait problem or joint swelling.  Skin: Negative for rash.  Neurological: positive  for intermittent dizziness but headache.  No other specific complaints in a complete review of systems (except as listed in HPI above).  Objective  Vitals:   11/08/18 1551 11/08/18 1608  BP: 118/74   Pulse: (!) 120 100  Resp: 16   Temp: 97.9 F (36.6 C)   TempSrc: Oral   SpO2: 98%   Weight: 171 lb 11.2 oz (77.9 kg)   Height: '5\' 1"'  (1.549 m)     Body mass index is 32.44 kg/m.  Physical Exam  Constitutional: Patient appears well-developed and well-nourished. No distress.  HENT: Head: Normocephalic and atraumatic. Ears: B TMs ok, no erythema or effusion; Nose: Nose normal. Mouth/Throat: Oropharynx is clear and moist. No oropharyngeal exudate.  Eyes: Conjunctivae and EOM are normal. Pupils are equal, round, and reactive to light. No scleral icterus.  Neck: Normal range of motion. Neck supple. No JVD present. No thyromegaly present.  Cardiovascular: Normal rate, regular rhythm and normal heart sounds.  No murmur heard. No BLE edema. Pulmonary/Chest: Effort normal and breath sounds normal. No respiratory distress. Abdominal: Soft. Bowel sounds are normal, no distension. There is no tenderness. no masses Breast: no lumps or masses, no nipple discharge or rashes FEMALE GENITALIA:  External genitalia normal External urethra normal Vaginal vault normal without discharge or lesions Cervix normal without discharge or lesions Bimanual exam normal without masses RECTAL: not done  Musculoskeletal: Normal range of motion, no joint effusions. No gross deformities Neurological: he is alert and oriented to person, place, and time. No cranial nerve deficit. Coordination, balance, strength, speech and  gait are normal.  Skin: Skin is warm and dry. No rash noted. No erythema.  Psychiatric: Patient has a  normal mood and affect. behavior is normal. Judgment and thought content normal.  PHQ2/9: Depression screen [facility] 2/9 11/08/2018 08/26/2018 07/26/2018 03/28/2018 02/03/2018  Decreased Interest 0 0 0 0 0  Down, Depressed, Hopeless 0 0 0 0 0  PHQ - 2 Score 0 0 0 0 0  Altered sleeping 0 0 0 0 1  Tired, decreased energy 0 0 3 0 1  Change in appetite 0 0 0 0 0  Feeling bad or failure about yourself  0 0 0 0 0  Trouble concentrating 0 0 0 0 0  Moving slowly or fidgety/restless 0 0 0 0 0  Suicidal thoughts 0 0 0 0 0  PHQ-9 Score 0 0 3 0 2  Difficult doing work/chores Not difficult at all Not difficult at all Not difficult at all Not difficult at all Not difficult at all     Fall Risk: Fall Risk  11/08/2018 08/26/2018 07/26/2018 03/28/2018 02/03/2018  Falls in the past year? 0 0 0 0 No  Number falls in past yr: 0 0 0 0 -  Injury with Fall? 0 0 0 - -     Functional Status Survey: Is the patient deaf or have difficulty hearing?: No Does the patient have difficulty seeing, even when wearing glasses/contacts?: No Does the patient have difficulty concentrating, remembering, or making decisions?: No Does the patient have difficulty walking or climbing stairs?: No Does the patient have difficulty dressing or bathing?: No Does the patient have difficulty doing errands alone such as visiting a doctor's office or shopping?: No    Assessment & Plan   1. On Depo-Provera for contraception  - POCT urine pregnancy - medroxyPROGESTERone Acetate SUSY 150 mg  2. Palpitation  - Ambulatory referral to Cardiology - TSH - COMPLETE METABOLIC PANEL WITH GFR - CBC with Differential/Platelet  3. Well woman exam  - TSH - Lipid panel - COMPLETE METABOLIC PANEL WITH GFR - CBC with Differential/Platelet - Hemoglobin A1c - Iron, TIBC and Ferritin Panel - Sickle Cell Scr - Cytology - PAP  Same sexual partner past 3 years, she does not want to be checked for STI's but okay with pap and GC   4. Iron  deficiency  - Iron, TIBC and Ferritin Panel  5. Insulin resistance  - Hemoglobin A1c  6. Encounter for sickle-cell screening  - Sickle Cell Scr  7. Lipid screening  - Lipid panel   -USPSTF grade A and B recommendations reviewed with patient; age-appropriate recommendations, preventive care, screening tests, etc discussed and encouraged; healthy living encouraged; see AVS for patient education given to patient -Discussed importance of 150 minutes of physical activity weekly, eat two servings of fish weekly, eat one serving of tree nuts ( cashews, pistachios, pecans, almonds.Marland Kitchen) every other day, eat 6 servings of fruit/vegetables daily and drink plenty of water and avoid sweet beverages.

## 2018-11-08 NOTE — Patient Instructions (Signed)
Try a keto diet for two weeks ( carb count below 20 g daily)  Third week you can go up to 30 g per day Try to stay below 50 always   Preventive Care 18-39 Years, Female Preventive care refers to lifestyle choices and visits with your health care provider that can promote health and wellness. What does preventive care include?   A yearly physical exam. This is also called an annual well check.  Dental exams once or twice a year.  Routine eye exams. Ask your health care provider how often you should have your eyes checked.  Personal lifestyle choices, including: ? Daily care of your teeth and gums. ? Regular physical activity. ? Eating a healthy diet. ? Avoiding tobacco and drug use. ? Limiting alcohol use. ? Practicing safe sex. ? Taking vitamin and mineral supplements as recommended by your health care provider. What happens during an annual well check? The services and screenings done by your health care provider during your annual well check will depend on your age, overall health, lifestyle risk factors, and family history of disease. Counseling Your health care provider may ask you questions about your:  Alcohol use.  Tobacco use.  Drug use.  Emotional well-being.  Home and relationship well-being.  Sexual activity.  Eating habits.  Work and work Statistician.  Method of birth control.  Menstrual cycle.  Pregnancy history. Screening You may have the following tests or measurements:  Height, weight, and BMI.  Diabetes screening. This is done by checking your blood sugar (glucose) after you have not eaten for a while (fasting).  Blood pressure.  Lipid and cholesterol levels. These may be checked every 5 years starting at age 39.  Skin check.  Hepatitis C blood test.  Hepatitis B blood test.  Sexually transmitted disease (STD) testing.  BRCA-related cancer screening. This may be done if you have a family history of breast, ovarian, tubal, or  peritoneal cancers.  Pelvic exam and Pap test. This may be done every 3 years starting at age 71. Starting at age 41, this may be done every 5 years if you have a Pap test in combination with an HPV test. Discuss your test results, treatment options, and if necessary, the need for more tests with your health care provider. Vaccines Your health care provider may recommend certain vaccines, such as:  Influenza vaccine. This is recommended every year.  Tetanus, diphtheria, and acellular pertussis (Tdap, Td) vaccine. You may need a Td booster every 10 years.  Varicella vaccine. You may need this if you have not been vaccinated.  HPV vaccine. If you are 20 or younger, you may need three doses over 6 months.  Measles, mumps, and rubella (MMR) vaccine. You may need at least one dose of MMR. You may also need a second dose.  Pneumococcal 13-valent conjugate (PCV13) vaccine. You may need this if you have certain conditions and were not previously vaccinated.  Pneumococcal polysaccharide (PPSV23) vaccine. You may need one or two doses if you smoke cigarettes or if you have certain conditions.  Meningococcal vaccine. One dose is recommended if you are age 33-21 years and a first-year college student living in a residence hall, or if you have one of several medical conditions. You may also need additional booster doses.  Hepatitis A vaccine. You may need this if you have certain conditions or if you travel or work in places where you may be exposed to hepatitis A.  Hepatitis B vaccine. You may need this  if you have certain conditions or if you travel or work in places where you may be exposed to hepatitis B.  Haemophilus influenzae type b (Hib) vaccine. You may need this if you have certain risk factors. Talk to your health care provider about which screenings and vaccines you need and how often you need them. This information is not intended to replace advice given to you by your health care  provider. Make sure you discuss any questions you have with your health care provider. Document Released: 07/07/2001 Document Revised: 12/22/2016 Document Reviewed: 03/12/2015 Elsevier Interactive Patient Education  2019 [employer].

## 2018-11-09 LAB — LIPID PANEL
Cholesterol: 199 mg/dL (ref <200)
HDL: 62 mg/dL (ref >=50)
LDL Cholesterol (Calc): 122 mg/dL (calc) — ABNORMAL HIGH
Non-HDL Cholesterol (Calc): 137 mg/dL (calc) — ABNORMAL HIGH (ref <130)
Total CHOL/HDL Ratio: 3.2 (calc) (ref <5.0)
Triglycerides: 48 mg/dL (ref <150)

## 2018-11-09 LAB — COMPLETE METABOLIC PANEL WITH GFR
AG Ratio: 1.4 (calc) (ref 1.0–2.5)
ALT: 8 U/L (ref 6–29)
AST: 12 U/L (ref 10–30)
Albumin: 4.2 g/dL (ref 3.6–5.1)
Alkaline phosphatase (APISO): 68 U/L (ref 31–125)
BUN: 9 mg/dL (ref 7–25)
CO2: 28 mmol/L (ref 20–32)
Calcium: 9.6 mg/dL (ref 8.6–10.2)
Chloride: 100 mmol/L (ref 98–110)
Creat: 0.74 mg/dL (ref 0.50–1.10)
GFR, Est African American: 130 mL/min/{1.73_m2} (ref >=60)
GFR, Est Non African American: 113 mL/min/{1.73_m2} (ref >=60)
Globulin: 3 g/dL (calc) (ref 1.9–3.7)
Glucose, Bld: 123 mg/dL — ABNORMAL HIGH (ref 65–99)
Potassium: 3.4 mmol/L — ABNORMAL LOW (ref 3.5–5.3)
Sodium: 138 mmol/L (ref 135–146)
Total Bilirubin: 0.4 mg/dL (ref 0.2–1.2)
Total Protein: 7.2 g/dL (ref 6.1–8.1)

## 2018-11-09 LAB — CBC WITH DIFFERENTIAL/PLATELET
Absolute Monocytes: 563 cells/uL (ref 200–950)
Basophils Absolute: 18 cells/uL (ref 0–200)
Basophils Relative: 0.2 %
Eosinophils Absolute: 176 cells/uL (ref 15–500)
Eosinophils Relative: 2 %
HCT: 38.2 % (ref 35.0–45.0)
Hemoglobin: 12.4 g/dL (ref 11.7–15.5)
Lymphs Abs: 3124 cells/uL (ref 850–3900)
MCH: 25.3 pg — ABNORMAL LOW (ref 27.0–33.0)
MCHC: 32.5 g/dL (ref 32.0–36.0)
MCV: 78 fL — ABNORMAL LOW (ref 80.0–100.0)
MPV: 8.7 fL (ref 7.5–12.5)
Monocytes Relative: 6.4 %
Neutro Abs: 4919 cells/uL (ref 1500–7800)
Neutrophils Relative %: 55.9 %
Platelets: 418 10*3/uL — ABNORMAL HIGH (ref 140–400)
RBC: 4.9 10*6/uL (ref 3.80–5.10)
RDW: 13.2 % (ref 11.0–15.0)
Total Lymphocyte: 35.5 %
WBC: 8.8 10*3/uL (ref 3.8–10.8)

## 2018-11-09 LAB — HEMOGLOBIN A1C
Hgb A1c MFr Bld: 5.5 % of total Hgb (ref <5.7)
Mean Plasma Glucose: 111 (calc)
eAG (mmol/L): 6.2 (calc)

## 2018-11-09 LAB — TSH: TSH: 0.65 mIU/L

## 2018-11-09 LAB — IRON,TIBC AND FERRITIN PANEL
%SAT: 12 % (calc) — ABNORMAL LOW (ref 16–45)
Ferritin: 108 ng/mL (ref 16–154)
Iron: 36 ug/dL — ABNORMAL LOW (ref 40–190)
TIBC: 311 mcg/dL (calc) (ref 250–450)

## 2018-11-09 LAB — SICKLE CELL SCREEN: Sickle Solubility Test - HGBRFX: NEGATIVE

## 2018-11-11 LAB — CYTOLOGY - PAP
Chlamydia: NEGATIVE
Diagnosis: NEGATIVE
Neisseria Gonorrhea: NEGATIVE

## 2018-12-06 ENCOUNTER — Ambulatory Visit: Payer: 59 | Admitting: Nurse Practitioner

## 2018-12-08 ENCOUNTER — Ambulatory Visit (INDEPENDENT_AMBULATORY_CARE_PROVIDER_SITE_OTHER): Payer: 59 | Admitting: Family Medicine

## 2018-12-08 ENCOUNTER — Other Ambulatory Visit (HOSPITAL_COMMUNITY)
Admission: RE | Admit: 2018-12-08 | Discharge: 2018-12-08 | Disposition: A | Discharge status: Home / Self Care | Payer: 59 | Source: Ambulatory Visit | Attending: Family Medicine | Admitting: Family Medicine

## 2018-12-08 ENCOUNTER — Encounter: Payer: Self-pay | Admitting: Family Medicine

## 2018-12-08 ENCOUNTER — Other Ambulatory Visit: Payer: Self-pay

## 2018-12-08 VITALS — BP 116/78 | HR 103 | Temp 97.1°F | Resp 16 | Ht 61.0 in | Wt 165.5 lb

## 2018-12-08 DIAGNOSIS — E611 Iron deficiency: Secondary | ICD-10-CM | POA: Diagnosis not present

## 2018-12-08 DIAGNOSIS — Z113 Encounter for screening for infections with a predominantly sexual mode of transmission: Secondary | ICD-10-CM | POA: Insufficient documentation

## 2018-12-08 DIAGNOSIS — D473 Essential (hemorrhagic) thrombocythemia: Secondary | ICD-10-CM | POA: Diagnosis not present

## 2018-12-08 DIAGNOSIS — R739 Hyperglycemia, unspecified: Secondary | ICD-10-CM | POA: Insufficient documentation

## 2018-12-08 DIAGNOSIS — D75839 Thrombocytosis, unspecified: Secondary | ICD-10-CM

## 2018-12-08 NOTE — Progress Notes (Signed)
Name: Shelley Davis   MRN: [id number]    DOB: Jan 30, 1996   Date:12/08/2018       Progress Note  Subjective  Chief Complaint  Chief Complaint  Patient presents with  . STD Testing    States boyfriend was not faithful and would like some labs     HPI  STI screen: she just ended a relationship and after the fact she heard he was cheating on her. Last intercourse a couple of weeks ago. She denies vaginal discharge, pelvic pain, rashes, fever or chills.   Obesity: lost 6 lbs since last visit, discussed carbohydrate restrictive diet.  Thrombocytosis and iron deficiency: discussed referral to hematologist but she would like to hold off for now, no sob or pica. Discussed starting a mvi with iron  Hyperglycemia with obesity: gave her a CRD and we will check for insulin resistance, discussed physical activity.  Patient Active Problem List   Diagnosis Date Noted  . Class 1 obesity due to excess calories without serious comorbidity with body mass index (BMI) of 31.0 to 31.9 in adult 07/26/2018  . Lightheadedness 07/26/2018  . On Depo-Provera for contraception 03/28/2018  . Asthma, mild intermittent 08/05/2016  . Low serum vitamin B12 08/05/2016  . Primary dysmenorrhea 07/08/2016  . Perennial allergic rhinitis with seasonal variation 12/05/2015  . Allergic to peanuts 12/05/2015  . Vitamin D deficiency 12/05/2015  . Eczema 12/05/2015    Past Surgical History:  Procedure Laterality Date  . WISDOM TOOTH EXTRACTION Bilateral     Family History  Problem Relation Age of Onset  . Hypertension Mother   . Asthma Sister   . Lung disease Maternal Grandmother   . Migraines Sister     Social History   Socioeconomic History  . Marital status: Single    Spouse name: Not on file  . Number of children: 0  . Years of education: 50  . Highest education level: High school graduate  Occupational History  . Occupation: Office manager parts at work     Fish farm manager: [employer]  Social Needs  . Financial resource  strain: Not hard at all  . Food insecurity    Worry: Never true    Inability: Never true  . Transportation needs    Medical: No    Non-medical: No  Tobacco Use  . Smoking status: Never Smoker  . Smokeless tobacco: Never Used  Substance and Sexual Activity  . Alcohol use: Yes    Comment: occasionally  . Drug use: No  . Sexual activity: Yes    Partners: Male    Birth control/protection: Injection  Lifestyle  . Physical activity    Days per week: 0 days    Minutes per session: 0 min  . Stress: Not at all  Relationships  . Social connections    Talks on phone: More than three times a week    Gets together: Three times a week    Attends religious service: Never    Active member of club or organization: No    Attends meetings of clubs or organizations: Never    Relationship status: Never married  . Intimate partner violence    Fear of current or ex partner: No    Emotionally abused: No    Physically abused: No    Forced sexual activity: No  Other Topics Concern  . 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  albuterol (PROVENTIL HFA;VENTOLIN HFA) 108 (90 Base) MCG/ACT inhaler, Inhale 2 puffs into the lungs every 6 (six) hours as needed for wheezing or shortness of breath., Disp: 1 Inhaler, Rfl: 0 .  azelastine (OPTIVAR) 0.05 % ophthalmic solution, Place 2 drops into both eyes 2 (two) times daily., Disp: , Rfl:  .  EPINEPHrine 0.3 mg/0.3 mL IJ SOAJ injection, INJECT 0.3 MLS (0.3 MG TOTAL) INTO THE MUSCLE ONCE., Disp: 2 mL, Rfl: 1 .  famotidine (PEPCID) 20 MG tablet, Take 1 tablet (20 mg total) by mouth 2 (two) times daily., Disp: 180 tablet, Rfl: 1 .  ketoconazole (NIZORAL) 2 % shampoo, Apply topically 2 (two) times a week., Disp: 120 mL, Rfl: 2 .  levocetirizine (XYZAL) 5 MG tablet, Take 1 tablet (5 mg total) by mouth  every evening., Disp: 90 tablet, Rfl: 1 .  medroxyPROGESTERone Acetate 150 MG/ML SUSY, INJECT 1 ML (150 MG TOTAL) INTO THE MUSCLE EVERY 3 (THREE) MONTHS., Disp: 1 Syringe, Rfl: 1 .  montelukast (SINGULAIR) 10 MG tablet, Take 1 tablet (10 mg total) by mouth at bedtime., Disp: 90 tablet, Rfl: 1 .  Vitamin D, Ergocalciferol, (DRISDOL) 1.25 MG (50000 UT) CAPS capsule, Take 1 capsule (50,000 Units total) by mouth every 7 (seven) days., Disp: 8 capsule, Rfl: 0  Allergies  Allergen Reactions  . Peanut Oil   . Peanut-Containing Drug Products Itching and Other (See Comments)    Throat swelling and becomes itchy     I personally reviewed active problem list, medication list, allergies, family history, social history with the patient/caregiver today.   ROS  Ten systems reviewed and is negative except as mentioned in HPI   Objective  Vitals:   12/08/18 0855  BP: 116/78  Pulse: (!) 103  Resp: 16  Temp: (!) 97.1 F (36.2 C)  TempSrc: Temporal  SpO2: 98%  Weight: 165 lb 8 oz (75.1 kg)  Height: 5\' 1"  (1.549 m)    Body mass index is 31.27 kg/m.  Physical Exam  Constitutional: Patient appears well-developed and well-nourished. Obese  No distress.  HEENT: head atraumatic, normocephalic, pupils equal and reactive to light, neck supple Cardiovascular: Normal rate, regular rhythm and normal heart sounds.  No murmur heard. No BLE edema. Pulmonary/Chest: Effort normal and breath sounds normal. No respiratory distress. Abdominal: Soft.  There is no tenderness. Psychiatric: Patient has a normal mood and affect. behavior is normal. Judgment and thought content normal.  Recent Results (from the past 2160 hour(s))  Cytology - PAP     Status: None   Collection Time: 11/08/18 12:00 AM  Result Value Ref Range   Adequacy      Satisfactory for evaluation  endocervical/transformation zone component PRESENT.   Diagnosis      NEGATIVE FOR INTRAEPITHELIAL LESIONS OR MALIGNANCY.   Chlamydia Negative      Comment: Normal Reference Range - Negative   Neisseria gonorrhea Negative     Comment: Normal Reference Range - Negative   Material Submitted CervicoVaginal Pap [ThinPrep Imaged]    CYTOLOGY - PAP PAP RESULT   POCT urine pregnancy     Status: Normal   Collection Time: 11/08/18  4:04 PM  Result Value Ref Range   Preg Test, Ur Negative Negative  TSH     Status: None   Collection Time: 11/08/18  4:28 PM  Result Value Ref Range   TSH 0.65 mIU/L    Comment:  Reference Range .           > or = 20 Years  0.40-4.50 .                Pregnancy Ranges           First trimester    0.26-2.66           Second trimester   0.55-2.73           Third trimester    0.43-2.91   Lipid panel     Status: Abnormal   Collection Time: 11/08/18  4:28 PM  Result Value Ref Range   Cholesterol 199 <200 mg/dL   HDL 62 > OR = 50 mg/dL   Triglycerides 48 <150 mg/dL   LDL Cholesterol (Calc) 122 (H) mg/dL (calc)    Comment: Reference range: <100 . Desirable range <100 mg/dL for primary prevention;   <70 mg/dL for patients with CHD or diabetic patients  with > or = 2 CHD risk factors. Marland Kitchen LDL-C is now calculated using the Martin-Hopkins  calculation, which is a validated novel method providing  better accuracy than the Friedewald equation in the  estimation of LDL-C.  Cresenciano Genre et al. Annamaria Helling. 8850;277(41): 2061-2068  (http://education.[facility].com/faq/FAQ164)    Total CHOL/HDL Ratio 3.2 <5.0 (calc)   Non-HDL Cholesterol (Calc) 137 (H) <130 mg/dL (calc)    Comment: For patients with diabetes plus 1 major ASCVD risk  factor, treating to a non-HDL-C goal of <100 mg/dL  (LDL-C of <70 mg/dL) is considered a therapeutic  option.   COMPLETE METABOLIC PANEL WITH GFR     Status: Abnormal   Collection Time: 11/08/18  4:28 PM  Result Value Ref Range   Glucose, Bld 123 (H) 65 - 99 mg/dL    Comment: .            Fasting reference interval . For someone without known diabetes, a glucose  value between 100 and 125 mg/dL is consistent with prediabetes and should be confirmed with a follow-up test. .    BUN 9 7 - 25 mg/dL   Creat 0.74 0.50 - 1.10 mg/dL   GFR, Est Non African American 113 > OR = 60 mL/min/1.79m2   GFR, Est African American 130 > OR = 60 mL/min/1.57m2   BUN/Creatinine Ratio NOT APPLICABLE 6 - 22 (calc)   Sodium 138 135 - 146 mmol/L   Potassium 3.4 (L) 3.5 - 5.3 mmol/L   Chloride 100 98 - 110 mmol/L   CO2 28 20 - 32 mmol/L   Calcium 9.6 8.6 - 10.2 mg/dL   Total Protein 7.2 6.1 - 8.1 g/dL   Albumin 4.2 3.6 - 5.1 g/dL   Globulin 3.0 1.9 - 3.7 g/dL (calc)   AG Ratio 1.4 1.0 - 2.5 (calc)   Total Bilirubin 0.4 0.2 - 1.2 mg/dL   Alkaline phosphatase (APISO) 68 31 - 125 U/L   AST 12 10 - 30 U/L   ALT 8 6 - 29 U/L  CBC with Differential/Platelet     Status: Abnormal   Collection Time: 11/08/18  4:28 PM  Result Value Ref Range   WBC 8.8 3.8 - 10.8 Thousand/uL   RBC 4.90 3.80 - 5.10 Million/uL   Hemoglobin 12.4 11.7 - 15.5 g/dL   HCT 38.2 35.0 - 45.0 %   MCV 78.0 (L) 80.0 - 100.0 fL   MCH 25.3 (L) 27.0 - 33.0 pg   MCHC 32.5 32.0 - 36.0 g/dL   RDW 13.2 11.0 - 15.0 %  Platelets 418 (H) 140 - 400 Thousand/uL   MPV 8.7 7.5 - 12.5 fL   Neutro Abs 4,919 1,500 - 7,800 cells/uL   Lymphs Abs 3,124 850 - 3,900 cells/uL   Absolute Monocytes 563 200 - 950 cells/uL   Eosinophils Absolute 176 15 - 500 cells/uL   Basophils Absolute 18 0 - 200 cells/uL   Neutrophils Relative % 55.9 %   Total Lymphocyte 35.5 %   Monocytes Relative 6.4 %   Eosinophils Relative 2.0 %   Basophils Relative 0.2 %  Hemoglobin A1c     Status: None   Collection Time: 11/08/18  4:28 PM  Result Value Ref Range   Hgb A1c MFr Bld 5.5 <5.7 % of total Hgb    Comment: For the purpose of screening for the presence of diabetes: . <5.7%       Consistent with the absence of diabetes 5.7-6.4%    Consistent with increased risk for diabetes             (prediabetes) > or =6.5%  Consistent with  diabetes . This assay result is consistent with a decreased risk of diabetes. . Currently, no consensus exists regarding use of hemoglobin A1c for diagnosis of diabetes in children. . According to American Diabetes Association (ADA) guidelines, hemoglobin A1c <7.0% represents optimal control in non-pregnant diabetic patients. Different metrics may apply to specific patient populations.  Standards of Medical Care in Diabetes(ADA). .    Mean Plasma Glucose 111 (calc)   eAG (mmol/L) 6.2 (calc)  Iron, TIBC and Ferritin Panel     Status: Abnormal   Collection Time: 11/08/18  4:28 PM  Result Value Ref Range   Iron 36 (L) 40 - 190 mcg/dL   TIBC 311 250 - 450 mcg/dL (calc)   %SAT 12 (L) 16 - 45 % (calc)   Ferritin 108 16 - 154 ng/mL  Sickle Cell Scr     Status: None   Collection Time: 11/08/18  4:28 PM  Result Value Ref Range   Sickle Solubility Test - HGBRFX NEGATIVE NEGATIVE    Comment: . Hemoglobin solubility testing alone is insufficient for detecting or confirming the presence of sickling hemoglobins in some situations. Additional testing may be required for diagnosis of hemoglobinopathies. For more information on this test go to: http://education.[facility].com/faq/FAQ99v1 .      PHQ2/9: Depression screen [facility] 2/9 12/08/2018 11/08/2018 08/26/2018 07/26/2018 03/28/2018  Decreased Interest 0 0 0 0 0  Down, Depressed, Hopeless 0 0 0 0 0  PHQ - 2 Score 0 0 0 0 0  Altered sleeping 0 0 0 0 0  Tired, decreased energy 0 0 0 3 0  Change in appetite 0 0 0 0 0  Feeling bad or failure about yourself  0 0 0 0 0  Trouble concentrating 0 0 0 0 0  Moving slowly or fidgety/restless 0 0 0 0 0  Suicidal thoughts 0 0 0 0 0  PHQ-9 Score 0 0 0 3 0  Difficult doing work/chores Not difficult at all Not difficult at all Not difficult at all Not difficult at all Not difficult at all    phq 9 is negative   Fall Risk: Fall Risk  12/08/2018 11/08/2018 08/26/2018 07/26/2018 03/28/2018  Falls in  the past year? 0 0 0 0 0  Number falls in past yr: 0 0 0 0 0  Injury with Fall? 0 0 0 0 -    Assessment & Plan  1. Iron deficiency  - CBC  2.  Hyperglycemia  - Insulin, Free (Bioactive)  3. Routine screening for STI (sexually transmitted infection)  - RPR - HIV Antibody (routine testing w rflx) - Hepatitis panel, acute  4. Thrombocytosis (HCC)  - CBC

## 2018-12-13 LAB — CERVICOVAGINAL ANCILLARY ONLY
Chlamydia: NEGATIVE
Neisseria Gonorrhea: NEGATIVE
Trichomonas: NEGATIVE

## 2018-12-15 LAB — CBC
HCT: 39.9 % (ref 35.0–45.0)
Hemoglobin: 13 g/dL (ref 11.7–15.5)
MCH: 25.5 pg — ABNORMAL LOW (ref 27.0–33.0)
MCHC: 32.6 g/dL (ref 32.0–36.0)
MCV: 78.2 fL — ABNORMAL LOW (ref 80.0–100.0)
MPV: 8.9 fL (ref 7.5–12.5)
Platelets: 412 10*3/uL — ABNORMAL HIGH (ref 140–400)
RBC: 5.1 10*6/uL (ref 3.80–5.10)
RDW: 13.7 % (ref 11.0–15.0)
WBC: 6.6 10*3/uL (ref 3.8–10.8)

## 2018-12-15 LAB — HEPATITIS PANEL, ACUTE
Hep A IgM: NONREACTIVE
Hep B C IgM: NONREACTIVE
Hepatitis B Surface Ag: NONREACTIVE
Hepatitis C Ab: NONREACTIVE
SIGNAL TO CUT-OFF: 0.02 (ref <1.00)

## 2018-12-15 LAB — INSULIN, FREE (BIOACTIVE): Insulin, Free: 12 u[IU]/mL (ref 1.5–14.9)

## 2018-12-15 LAB — HIV ANTIBODY (ROUTINE TESTING W REFLEX): HIV 1&2 Ab, 4th Generation: NONREACTIVE

## 2018-12-15 LAB — RPR: RPR Ser Ql: NONREACTIVE

## 2018-12-26 ENCOUNTER — Telehealth: Payer: Self-pay

## 2018-12-26 NOTE — Telephone Encounter (Signed)
Copied from [place] [id number]. Topic: General - Inquiry >> Dec 26, 2018 11:41 AM Richardo Priest, NT wrote: Reason for CRM: Butch Penny from [facility] called in stating they are needing a demographic sheet, office notes for the last 2 visits, and an EKG if one was done. Please advise and fax over to [phone] with attention to Pedro Earls.   All information has been faxed as requested to [place].

## 2019-01-24 ENCOUNTER — Ambulatory Visit (INDEPENDENT_AMBULATORY_CARE_PROVIDER_SITE_OTHER): Payer: 59

## 2019-01-24 ENCOUNTER — Encounter: Payer: Self-pay | Admitting: Cardiovascular Disease

## 2019-01-24 ENCOUNTER — Other Ambulatory Visit: Payer: Self-pay

## 2019-01-24 ENCOUNTER — Encounter

## 2019-01-24 ENCOUNTER — Ambulatory Visit (INDEPENDENT_AMBULATORY_CARE_PROVIDER_SITE_OTHER): Payer: 59 | Admitting: Cardiovascular Disease

## 2019-01-24 VITALS — BP 122/66 | HR 97 | Ht 60.0 in | Wt 163.1 lb

## 2019-01-24 DIAGNOSIS — R002 Palpitations: Secondary | ICD-10-CM

## 2019-01-24 NOTE — Patient Instructions (Signed)
Medication Instructions:  Your physician recommends that you continue on your current medications as directed. Please refer to the Current Medication list given to you today.  If you need a refill on your cardiac medications before your next appointment, please call your pharmacy.   Lab work: None ordered If you have labs (blood work) drawn today and your tests are completely normal, you will receive your results only by: Marland Kitchen MyChart Message (if you have MyChart) OR . A paper copy in the mail If you have any lab test that is abnormal or we need to change your treatment, we will call you to review the results.  Testing/Procedures: Your physician has recommended that you wear an zio-monitor.zio monitors are medical devices that record the heart's electrical activity. Doctors most often [place] these monitors to diagnose arrhythmias. Arrhythmias are problems with the speed or rhythm of the heartbeat. The monitor is a small, portable device. You can wear one while you do your normal daily activities. This is usually used to diagnose what is causing palpitations/syncope (passing out).    Follow-Up: At [facility], you and your health needs are our priority.  As part of our continuing mission to provide you with exceptional heart care, we have created designated Provider Care Teams.  These Care Teams include your primary Cardiologist (physician) and Advanced Practice Providers (APPs -  Physician Assistants and Nurse Practitioners) who all work together to provide you with the care you need, when you need it. You will need a follow up appointment in  as needed.  You may see  Dr. Fletcher Anon or one of the following Advanced Practice Providers on your designated Care Team:   Murray Hodgkins, NP Christell Faith, PA-C . Marrianne Mood, PA-C  Any Other Special Instructions Will Be Listed Below (If Applicable). N/A

## 2019-01-24 NOTE — Progress Notes (Signed)
Cardiology Office Note   Date:  01/24/2019   ID:  Shelley Davis, DOB 01-Jul-1995, MRN [id number]  PCP:  Steele Sizer, MD  Cardiologist:   Kathlyn Sacramento, MD   Chief Complaint  Patient presents with  . other    Palpatations, no cardiac HX. Medications reviewed verbally.      History of Present Illness: Shelley Davis is a 23 y.o. female who was referred by Dr. Ancil Boozer for evaluation and management of palpitations.  She has no prior cardiac history and overall has been healthy throughout her life with no significant chronic medical conditions.  She is not a smoker.  She drinks alcohol occasionally.  No excessive caffeine intake.  There is no family history of coronary artery disease, arrhythmia or sudden death. She reports intermittent palpitations over the last few months described as skipping in her heart mostly at rest and not with exertion.  She has occasional episodes of chest pain which is brief and typically not with exertion.  No significant shortness of breath.  No previous syncope or presyncope.  She had recent labs done which were unremarkable including thyroid function.    Past Medical History:  Diagnosis Date  . Allergy   . Eczema     Past Surgical History:  Procedure Laterality Date  . WISDOM TOOTH EXTRACTION Bilateral      Current Outpatient Medications  Medication Sig Dispense Refill  . albuterol (PROVENTIL HFA;VENTOLIN HFA) 108 (90 Base) MCG/ACT inhaler Inhale 2 puffs into the lungs every 6 (six) hours as needed for wheezing or shortness of breath. 1 Inhaler 0  . azelastine (OPTIVAR) 0.05 % ophthalmic solution Place 2 drops into both eyes 2 (two) times daily.    Marland Kitchen EPINEPHrine 0.3 mg/0.3 mL IJ SOAJ injection INJECT 0.3 MLS (0.3 MG TOTAL) INTO THE MUSCLE ONCE. 2 mL 1  . famotidine (PEPCID) 20 MG tablet Take 1 tablet (20 mg total) by mouth 2 (two) times daily. 180 tablet 1  . ketoconazole (NIZORAL) 2 % shampoo Apply topically 2 (two) times a week. 120 mL 2  .  levocetirizine (XYZAL) 5 MG tablet Take 1 tablet (5 mg total) by mouth every evening. 90 tablet 1  . medroxyPROGESTERone Acetate 150 MG/ML SUSY INJECT 1 ML (150 MG TOTAL) INTO THE MUSCLE EVERY 3 (THREE) MONTHS. 1 Syringe 1  . montelukast (SINGULAIR) 10 MG tablet Take 1 tablet (10 mg total) by mouth at bedtime. 90 tablet 1  . Vitamin D, Ergocalciferol, (DRISDOL) 1.25 MG (50000 UT) CAPS capsule Take 1 capsule (50,000 Units total) by mouth every 7 (seven) days. 8 capsule 0   No current facility-administered medications for this visit.     Allergies:   Peanut oil and Peanut-containing drug products    Social History:  The patient  reports that she has never smoked. She has never used smokeless tobacco. She reports current alcohol use. She reports that she does not use drugs.   Family History:  The patient's family history includes Asthma in her sister; Hypertension in her mother; Lung disease in her maternal grandmother; Migraines in her sister.    ROS:  Please see the history of present illness.   Otherwise, review of systems are positive for none.   All other systems are reviewed and negative.    PHYSICAL EXAM: VS:  BP 122/66 (BP Location: Left Arm, Patient Position: Sitting, Cuff Size: Normal)   Pulse 97   Ht 5' (1.524 m)   Wt 163 lb 1.9 oz (74 kg)  SpO2 100%   BMI 31.86 kg/m  , BMI Body mass index is 31.86 kg/m. GEN: Well nourished, well developed, in no acute distress  HEENT: normal  Neck: no JVD, carotid bruits, or masses Cardiac: RRR; no murmurs, rubs, or gallops,no edema  Respiratory:  clear to auscultation bilaterally, normal work of breathing GI: soft, nontender, nondistended, + BS MS: no deformity or atrophy  Skin: warm and dry, no rash Neuro:  Strength and sensation are intact Psych: euthymic mood, full affect   EKG:  EKG is ordered today. The ekg ordered today demonstrates normal sinus rhythm with no significant ST or T wave changes normal PR and QT intervals.    Recent Labs: 11/08/2018: ALT 8; BUN 9; Creat 0.74; Potassium 3.4; Sodium 138; TSH 0.65 12/08/2018: Hemoglobin 13.0; Platelets 412    Lipid Panel    Component Value Date/Time   CHOL 199 11/08/2018 1628   TRIG 48 11/08/2018 1628   HDL 62 11/08/2018 1628   CHOLHDL 3.2 11/08/2018 1628   VLDL 8 07/08/2016 0901   LDLCALC 122 (H) 11/08/2018 1628      Wt Readings from Last 3 Encounters:  01/24/19 163 lb 1.9 oz (74 kg)  12/08/18 165 lb 8 oz (75.1 kg)  11/08/18 171 lb 11.2 oz (77.9 kg)       No flowsheet data found.    ASSESSMENT AND PLAN:  1.  Palpitations: Likely due to premature beats based on her description.  Her cardiac exam is unremarkable with no abnormal sounds or murmurs.  Her baseline EKG is also normal.  Thus, doubt significant underlying structural heart abnormalities.  The patient is at very low risk for ischemic events. I recommend evaluation with a 2-week outpatient monitor.       Disposition:   FU with me as needed Signed,  Kathlyn Sacramento, MD  01/24/2019 2:22 PM    [facility]

## 2019-01-27 ENCOUNTER — Ambulatory Visit: Payer: 59 | Admitting: Family Medicine

## 2019-02-06 ENCOUNTER — Other Ambulatory Visit: Payer: Self-pay

## 2019-02-06 ENCOUNTER — Ambulatory Visit (INDEPENDENT_AMBULATORY_CARE_PROVIDER_SITE_OTHER): Payer: 59 | Admitting: Family Medicine

## 2019-02-06 ENCOUNTER — Encounter: Payer: Self-pay | Admitting: Family Medicine

## 2019-02-06 VITALS — BP 100/64 | HR 105 | Temp 97.3°F | Resp 12 | Ht 60.0 in | Wt 164.4 lb

## 2019-02-06 DIAGNOSIS — R000 Tachycardia, unspecified: Secondary | ICD-10-CM

## 2019-02-06 DIAGNOSIS — Z6831 Body mass index (BMI) 31.0-31.9, adult: Secondary | ICD-10-CM

## 2019-02-06 DIAGNOSIS — R739 Hyperglycemia, unspecified: Secondary | ICD-10-CM | POA: Diagnosis not present

## 2019-02-06 DIAGNOSIS — E559 Vitamin D deficiency, unspecified: Secondary | ICD-10-CM

## 2019-02-06 DIAGNOSIS — E8881 Metabolic syndrome: Secondary | ICD-10-CM

## 2019-02-06 DIAGNOSIS — J4520 Mild intermittent asthma, uncomplicated: Secondary | ICD-10-CM

## 2019-02-06 DIAGNOSIS — Z23 Encounter for immunization: Secondary | ICD-10-CM | POA: Diagnosis not present

## 2019-02-06 DIAGNOSIS — E6609 Other obesity due to excess calories: Secondary | ICD-10-CM

## 2019-02-06 NOTE — Progress Notes (Addendum)
Name: Shelley Davis   MRN: [id number]    DOB: [birth year]   Date:02/06/2019       Progress Note  Subjective  Chief Complaint  Chief Complaint  Patient presents with  . Asthma  . Hyperglycemia  . Contraception  . Eczema    HPI  Obesity: lost 1 lbs since last visit, she has been cutting down on eating out, but still has [employer] for lunch every day.   Thrombocytosis and iron deficiency: discussed referral to hematologist but she would like to hold off for now, no sob or pica. Reviewed labs from last visit, and it has improved, continue iron for now   Hyperglycemia with obesity: gave her a CRD, labs normal, she is only cutting down on portion size and fast food, down to once a day, and is doing better.   Asthma Mild intermittent: no wheezing or sob.   Palpitation: seen by Dr. Fletcher Anon, avoids caffeine, had two episodes in the past two weeks.   Patient Active Problem List   Diagnosis Date Noted  . Iron deficiency 12/08/2018  . Hyperglycemia 12/08/2018  . Class 1 obesity due to excess calories without serious comorbidity with body mass index (BMI) of 31.0 to 31.9 in adult 07/26/2018  . Lightheadedness 07/26/2018  . On Depo-Provera for contraception 03/28/2018  . Asthma, mild intermittent 08/05/2016  . Low serum vitamin B12 08/05/2016  . Primary dysmenorrhea 07/08/2016  . Perennial allergic rhinitis with seasonal variation 12/05/2015  . Allergic to peanuts 12/05/2015  . Vitamin D deficiency 12/05/2015  . Eczema 12/05/2015    Past Surgical History:  Procedure Laterality Date  . WISDOM TOOTH EXTRACTION Bilateral     Family History  Problem Relation Age of Onset  . Hypertension Mother   . Asthma Sister   . Lung disease Maternal Grandmother   . Migraines Sister     Social History   Socioeconomic History  . Marital status: Single    Spouse name: Not on file  . Number of children: 0  . Years of education: 1  . Highest education level: High school graduate   Occupational History  . Occupation: Office manager parts at work     Fish farm manager: [employer]  Social Needs  . Financial resource strain: Not hard at all  . Food insecurity    Worry: Never true    Inability: Never true  . Transportation needs    Medical: No    Non-medical: No  Tobacco Use  . Smoking status: Never Smoker  . Smokeless tobacco: Never Used  Substance and Sexual Activity  . Alcohol use: Yes    Comment: occasionally  . Drug use: No  . Sexual activity: Yes    Partners: Male    Birth control/protection: Injection  Lifestyle  . Physical activity    Days per week: 0 days    Minutes per session: 0 min  . Stress: Not at all  Relationships  . Social connections    Talks on phone: More than three times a week    Gets together: Three times a week    Attends religious service: Never    Active member of club or organization: No    Attends meetings of clubs or organizations: Never    Relationship status: Never married  . Intimate partner violence    Fear of current or ex partner: No    Emotionally abused: No    Physically abused: No    Forced sexual activity: No  Other Topics Concern  . 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  albuterol (PROVENTIL HFA;VENTOLIN HFA) 108 (90 Base) MCG/ACT inhaler, Inhale 2 puffs into the lungs every 6 (six) hours as needed for wheezing or shortness of breath., Disp: 1 Inhaler, Rfl: 0 .  azelastine (OPTIVAR) 0.05 % ophthalmic solution, Place 2 drops into both eyes 2 (two) times daily., Disp: , Rfl:  .  EPINEPHrine 0.3 mg/0.3 mL IJ SOAJ injection, INJECT 0.3 MLS (0.3 MG TOTAL) INTO THE MUSCLE ONCE., Disp: 2 mL, Rfl: 1 .  famotidine (PEPCID) 20 MG tablet, Take 1 tablet (20 mg total) by mouth 2 (two) times daily., Disp: 180 tablet, Rfl: 1 .  ketoconazole (NIZORAL) 2 % shampoo, Apply topically 2  (two) times a week., Disp: 120 mL, Rfl: 2 .  levocetirizine (XYZAL) 5 MG tablet, Take 1 tablet (5 mg total) by mouth every evening., Disp: 90 tablet, Rfl: 1 .  medroxyPROGESTERone Acetate 150 MG/ML SUSY, INJECT 1 ML (150 MG TOTAL) INTO THE MUSCLE EVERY 3 (THREE) MONTHS., Disp: 1 Syringe, Rfl: 1 .  montelukast (SINGULAIR) 10 MG tablet, Take 1 tablet (10 mg total) by mouth at bedtime., Disp: 90 tablet, Rfl: 1 .  Vitamin D, Ergocalciferol, (DRISDOL) 1.25 MG (50000 UT) CAPS capsule, Take 1 capsule (50,000 Units total) by mouth every 7 (seven) days., Disp: 8 capsule, Rfl: 0  Allergies  Allergen Reactions  . Peanut Oil   . Peanut-Containing Drug Products Itching and Other (See Comments)    Throat swelling and becomes itchy     I personally reviewed active problem list, medication list, allergies, family history, social history, health maintenance with the patient/caregiver today.   ROS  Constitutional: Negative for fever, positive for  weight change.  Respiratory: Negative for cough and shortness of breath.   Cardiovascular: Negative for chest pain, positive for intermittent  palpitations.  Gastrointestinal: Negative for abdominal pain, no bowel changes.  Musculoskeletal: Negative for gait problem or joint swelling.  Skin: Negative for rash.  Neurological: Negative for dizziness or headache.  No other specific complaints in a complete review of systems (except as listed in HPI above).  Objective  Vitals:   02/06/19 1425  BP: 100/64  Pulse: (!) 105  Resp: 12  Temp: (!) 97.3 F (36.3 C)  TempSrc: Temporal  SpO2: 99%  Weight: 164 lb 6.4 oz (74.6 kg)  Height: 5' (1.524 m)    Body mass index is 32.11 kg/m.  Physical Exam  Constitutional: Patient appears well-developed and well-nourished. Obese  No distress.  HEENT: head atraumatic, normocephalic, pupils equal and reactive to light Cardiovascular: Normal rate, regular rhythm and normal heart sounds.  No murmur heard. No BLE  edema. Pulmonary/Chest: Effort normal and breath sounds normal. No respiratory distress. Abdominal: Soft.  There is no tenderness. Psychiatric: Patient has a normal mood and affect. behavior is normal. Judgment and thought content normal.  Recent Results (from the past 2160 hour(s))  POCT urine pregnancy     Status: Normal   Collection Time: 11/08/18  4:04 PM  Result Value Ref Range   Preg Test, Ur Negative Negative  TSH     Status: None   Collection Time: 11/08/18  4:28 PM  Result Value Ref Range   TSH 0.65 mIU/L    Comment:           Reference Range .           >  or = 20 Years  0.40-4.50 .                Pregnancy Ranges           First trimester    0.26-2.66           Second trimester   0.55-2.73           Third trimester    0.43-2.91   Lipid panel     Status: Abnormal   Collection Time: 11/08/18  4:28 PM  Result Value Ref Range   Cholesterol 199 <200 mg/dL   HDL 62 > OR = 50 mg/dL   Triglycerides 48 <150 mg/dL   LDL Cholesterol (Calc) 122 (H) mg/dL (calc)    Comment: Reference range: <100 . Desirable range <100 mg/dL for primary prevention;   <70 mg/dL for patients with CHD or diabetic patients  with > or = 2 CHD risk factors. Marland Kitchen LDL-C is now calculated using the Martin-Hopkins  calculation, which is a validated novel method providing  better accuracy than the Friedewald equation in the  estimation of LDL-C.  Cresenciano Genre et al. Annamaria Helling. [id number]): 2061-2068  (http://education.[facility].com/faq/FAQ164)    Total CHOL/HDL Ratio 3.2 <5.0 (calc)   Non-HDL Cholesterol (Calc) 137 (H) <130 mg/dL (calc)    Comment: For patients with diabetes plus 1 major ASCVD risk  factor, treating to a non-HDL-C goal of <100 mg/dL  (LDL-C of <70 mg/dL) is considered a therapeutic  option.   COMPLETE METABOLIC PANEL WITH GFR     Status: Abnormal   Collection Time: 11/08/18  4:28 PM  Result Value Ref Range   Glucose, Bld 123 (H) 65 - 99 mg/dL    Comment: .            Fasting  reference interval . For someone without known diabetes, a glucose value between 100 and 125 mg/dL is consistent with prediabetes and should be confirmed with a follow-up test. .    BUN 9 7 - 25 mg/dL   Creat 0.74 0.50 - 1.10 mg/dL   GFR, Est Non African American 113 > OR = 60 mL/min/1.94m2   GFR, Est African American 130 > OR = 60 mL/min/1.79m2   BUN/Creatinine Ratio NOT APPLICABLE 6 - 22 (calc)   Sodium 138 135 - 146 mmol/L   Potassium 3.4 (L) 3.5 - 5.3 mmol/L   Chloride 100 98 - 110 mmol/L   CO2 28 20 - 32 mmol/L   Calcium 9.6 8.6 - 10.2 mg/dL   Total Protein 7.2 6.1 - 8.1 g/dL   Albumin 4.2 3.6 - 5.1 g/dL   Globulin 3.0 1.9 - 3.7 g/dL (calc)   AG Ratio 1.4 1.0 - 2.5 (calc)   Total Bilirubin 0.4 0.2 - 1.2 mg/dL   Alkaline phosphatase (APISO) 68 31 - 125 U/L   AST 12 10 - 30 U/L   ALT 8 6 - 29 U/L  CBC with Differential/Platelet     Status: Abnormal   Collection Time: 11/08/18  4:28 PM  Result Value Ref Range   WBC 8.8 3.8 - 10.8 Thousand/uL   RBC 4.90 3.80 - 5.10 Million/uL   Hemoglobin 12.4 11.7 - 15.5 g/dL   HCT 38.2 35.0 - 45.0 %   MCV 78.0 (L) 80.0 - 100.0 fL   MCH 25.3 (L) 27.0 - 33.0 pg   MCHC 32.5 32.0 - 36.0 g/dL   RDW 13.2 11.0 - 15.0 %   Platelets 418 (H) 140 - 400 Thousand/uL   MPV 8.7  7.5 - 12.5 fL   Neutro Abs 4,919 1,500 - 7,800 cells/uL   Lymphs Abs 3,124 850 - 3,900 cells/uL   Absolute Monocytes 563 200 - 950 cells/uL   Eosinophils Absolute 176 15 - 500 cells/uL   Basophils Absolute 18 0 - 200 cells/uL   Neutrophils Relative % 55.9 %   Total Lymphocyte 35.5 %   Monocytes Relative 6.4 %   Eosinophils Relative 2.0 %   Basophils Relative 0.2 %  Hemoglobin A1c     Status: None   Collection Time: 11/08/18  4:28 PM  Result Value Ref Range   Hgb A1c MFr Bld 5.5 <5.7 % of total Hgb    Comment: For the purpose of screening for the presence of diabetes: . <5.7%       Consistent with the absence of diabetes 5.7-6.4%    Consistent with increased risk  for diabetes             (prediabetes) > or =6.5%  Consistent with diabetes . This assay result is consistent with a decreased risk of diabetes. . Currently, no consensus exists regarding use of hemoglobin A1c for diagnosis of diabetes in children. . According to American Diabetes Association (ADA) guidelines, hemoglobin A1c <7.0% represents optimal control in non-pregnant diabetic patients. Different metrics may apply to specific patient populations.  Standards of Medical Care in Diabetes(ADA). .    Mean Plasma Glucose 111 (calc)   eAG (mmol/L) 6.2 (calc)  Iron, TIBC and Ferritin Panel     Status: Abnormal   Collection Time: 11/08/18  4:28 PM  Result Value Ref Range   Iron 36 (L) 40 - 190 mcg/dL   TIBC 311 250 - 450 mcg/dL (calc)   %SAT 12 (L) 16 - 45 % (calc)   Ferritin 108 16 - 154 ng/mL  Sickle Cell Scr     Status: None   Collection Time: 11/08/18  4:28 PM  Result Value Ref Range   Sickle Solubility Test - HGBRFX NEGATIVE NEGATIVE    Comment: . Hemoglobin solubility testing alone is insufficient for detecting or confirming the presence of sickling hemoglobins in some situations. Additional testing may be required for diagnosis of hemoglobinopathies. For more information on this test go to: http://education.[facility].com/faq/FAQ99v1 .   Cervicovaginal ancillary only     Status: None   Collection Time: 12/08/18 12:00 AM  Result Value Ref Range   Chlamydia Negative     Comment: Normal Reference Range - Negative   Neisseria gonorrhea Negative     Comment: Normal Reference Range - Negative   Trichomonas Negative     Comment: Normal Reference Range - Negative  Insulin, Free (Bioactive)     Status: None   Collection Time: 12/08/18  9:43 AM  Result Value Ref Range   Insulin, Free 12.0 1.5 - 14.9 uIU/mL    Comment: . Insulin levels vary widely in specimens taken from non-fasting individuals. . Insulin analogues may demonstrate non-linear cross-reactivity in  this assay. Interpret results accordingly.   CBC     Status: Abnormal   Collection Time: 12/08/18  9:43 AM  Result Value Ref Range   WBC 6.6 3.8 - 10.8 Thousand/uL   RBC 5.10 3.80 - 5.10 Million/uL   Hemoglobin 13.0 11.7 - 15.5 g/dL   HCT 39.9 35.0 - 45.0 %   MCV 78.2 (L) 80.0 - 100.0 fL   MCH 25.5 (L) 27.0 - 33.0 pg   MCHC 32.6 32.0 - 36.0 g/dL   RDW 13.7 11.0 - 15.0 %  Platelets 412 (H) 140 - 400 Thousand/uL   MPV 8.9 7.5 - 12.5 fL  RPR     Status: None   Collection Time: 12/08/18  9:43 AM  Result Value Ref Range   RPR Ser Ql NON-REACTIVE NON-REACTI  HIV Antibody (routine testing w rflx)     Status: None   Collection Time: 12/08/18  9:43 AM  Result Value Ref Range   HIV 1&2 Ab, 4th Generation NON-REACTIVE NON-REACTI    Comment: HIV-1 antigen and HIV-1/HIV-2 antibodies were not detected. There is no laboratory evidence of HIV infection. Marland Kitchen PLEASE NOTE: This information has been disclosed to you from records whose confidentiality may be protected by state law.  If your state requires such protection, then the state law prohibits you from making any further disclosure of the information without the specific written consent of the person to whom it pertains, or as otherwise permitted by law. A general authorization for the release of medical or other information is NOT sufficient for this purpose. . For additional information please refer to http://education.[facility].com/faq/FAQ106 (This link is being provided for informational/ educational purposes only.) . Marland Kitchen The performance of this assay has not been clinically validated in patients less than 14 years old. .   Hepatitis panel, acute     Status: None   Collection Time: 12/08/18  9:43 AM  Result Value Ref Range   Hep A IgM NON-REACTIVE NON-REACTI   Hepatitis B Surface Ag NON-REACTIVE NON-REACTI   Hep B C IgM NON-REACTIVE NON-REACTI   Hepatitis C Ab NON-REACTIVE NON-REACTI   SIGNAL TO CUT-OFF 0.02 <1.00     Comment: . HCV antibody was non-reactive. There is no laboratory  evidence of HCV infection. . In most cases, no further action is required. However, if recent HCV exposure is suspected, a test for HCV RNA (test code [phone]) is suggested. . For additional information please refer to http://education.[facility].com/faq/FAQ22v1 (This link is being provided for informational/ educational purposes only.) . Marland Kitchen For additional information, please refer to  http://education.[facility].com/faq/FAQ202  (This link is being provided for informational/ educational purposes only.) .       PHQ2/9: Depression screen [facility] 2/9 02/06/2019 12/08/2018 11/08/2018 08/26/2018 07/26/2018  Decreased Interest 0 0 0 0 0  Down, Depressed, Hopeless 0 0 0 0 0  PHQ - 2 Score 0 0 0 0 0  Altered sleeping 0 0 0 0 0  Tired, decreased energy 0 0 0 0 3  Change in appetite 0 0 0 0 0  Feeling bad or failure about yourself  0 0 0 0 0  Trouble concentrating 0 0 0 0 0  Moving slowly or fidgety/restless 0 0 0 0 0  Suicidal thoughts 0 0 0 0 0  PHQ-9 Score 0 0 0 0 3  Difficult doing work/chores - Not difficult at all Not difficult at all Not difficult at all Not difficult at all  Some recent data might be hidden    phq 9 is negative    Fall Risk: Fall Risk  02/06/2019 12/08/2018 11/08/2018 08/26/2018 07/26/2018  Falls in the past year? 1 0 0 0 0  Number falls in past yr: 1 0 0 0 0  Injury with Fall? 1 0 0 0 0    Functional Status Survey: Is the patient deaf or have difficulty hearing?: No Does the patient have difficulty seeing, even when wearing glasses/contacts?: No Does the patient have difficulty concentrating, remembering, or making decisions?: No Does the patient have difficulty walking or climbing stairs?: No  Does the patient have difficulty dressing or bathing?: No Does the patient have difficulty doing errands alone such as visiting a doctor's office or shopping?: No    Assessment & Plan  1. Mild  intermittent asthma without complication  Doing well at this time  2. Hyperglycemia  Normal A1C  3. Insulin resistance  Discussed healthier diet, packing lunch, meal planning   4. Vitamin D deficiency  Discussed otc vitamin D supplementation  5. Class 1 obesity due to excess calories without serious comorbidity with body mass index (BMI) of 31.0 to 31.9 in adult  Discussed with the patient the risk posed by an increased BMI. Discussed importance of portion control, calorie counting and at least 150 minutes of physical activity weekly. Avoid sweet beverages and drink more water. Eat at least 6 servings of fruit and vegetables daily   6. Tachycardia  Seeing Dr Fletcher Anon, and has a two week event monitor getting removed tomorrow , had two episodes while weraing   7. Need for immunization against influenza  - Flu Vaccine QUAD 36+ mos IM

## 2019-02-24 ENCOUNTER — Ambulatory Visit: Payer: 59 | Admitting: Family Medicine

## 2019-05-28 ENCOUNTER — Other Ambulatory Visit: Payer: Self-pay | Admitting: Family Medicine

## 2019-05-28 DIAGNOSIS — L210 Seborrhea capitis: Secondary | ICD-10-CM

## 2019-06-20 ENCOUNTER — Other Ambulatory Visit: Payer: Self-pay | Admitting: Family Medicine

## 2019-06-20 DIAGNOSIS — E559 Vitamin D deficiency, unspecified: Secondary | ICD-10-CM

## 2019-06-20 NOTE — Telephone Encounter (Signed)
Requested medication (s) are due for refill today: yes  Requested medication (s) are on the active medication list: yes  Last refill:  05/28/19  Future visit scheduled: yes  Notes to clinic:  not delegated    Requested Prescriptions  Pending Prescriptions Disp Refills   Vitamin D, Ergocalciferol, (DRISDOL) 1.25 MG (50000 UNIT) CAPS capsule [Pharmacy Med Name: VITAMIN D2 1.25MG (50,000 UNIT)] 4 capsule 1    Sig: Take 1 capsule (50,000 Units total) by mouth every 7 (seven) days.      Endocrinology:  Vitamins - Vitamin D Supplementation Failed - 06/20/2019  8:32 AM      Failed - 50,000 IU strengths are not delegated      Failed - Phosphate in normal range and within 360 days    No results found for: PHOS        Failed - Vitamin D in normal range and within 360 days    Vit D, 25-Hydroxy  Date Value Ref Range Status  07/26/2018 14 (L) 30 - 100 ng/mL Final    Comment:    Vitamin D Status         25-OH Vitamin D: . Deficiency:                    <20 ng/mL Insufficiency:             20 - 29 ng/mL Optimal:                 > or = 30 ng/mL . For 25-OH Vitamin D testing on patients on  D2-supplementation and patients for whom quantitation  of D2 and D3 fractions is required, the QuestAssureD(TM) 25-OH VIT D, (D2,D3), LC/MS/MS is recommended: order  code [id number] (patients >45yrs). . For more information on this test, go to: http://education.[facility].com/faq/FAQ163 (This link is being provided for  informational/educational purposes only.)           Passed - Ca in normal range and within 360 days    Calcium  Date Value Ref Range Status  11/08/2018 9.6 8.6 - 10.2 mg/dL Final          Passed - Valid encounter within last 12 months    Recent Outpatient Visits           4 months ago Mild intermittent asthma without complication   [facility] Steele Sizer, MD   6 months ago Routine screening for STI (sexually transmitted infection)   [facility] Steele Sizer, MD   7 months ago Well woman exam   [facility] Steele Sizer, MD   9 months ago [facility] Steele Sizer, MD   10 months ago [place], [place]       Future Appointments             In 1 month Steele Sizer, MD [facility], [facility]

## 2019-06-28 ENCOUNTER — Ambulatory Visit: Payer: Self-pay | Admitting: Family Medicine

## 2019-07-04 ENCOUNTER — Other Ambulatory Visit: Payer: Self-pay | Admitting: Family Medicine

## 2019-07-04 DIAGNOSIS — E559 Vitamin D deficiency, unspecified: Secondary | ICD-10-CM

## 2019-07-04 NOTE — Telephone Encounter (Signed)
Requested medication (s) are due for refill today: yes  Requested medication (s) are on the active medication list: yes  Last refill:  06/20/19  Future visit scheduled: yes  Notes to clinic:  not delegated    Requested Prescriptions  Pending Prescriptions Disp Refills   Vitamin D, Ergocalciferol, (DRISDOL) 1.25 MG (50000 UNIT) CAPS capsule [Pharmacy Med Name: VITAMIN D2 1.25MG (50,000 UNIT)] 12 capsule 1    Sig: TAKE 1 CAPSULE (50,000 UNITS TOTAL) BY MOUTH EVERY 7 (SEVEN) DAYS.      Endocrinology:  Vitamins - Vitamin D Supplementation Failed - 07/04/2019  8:35 AM      Failed - 50,000 IU strengths are not delegated      Failed - Phosphate in normal range and within 360 days    No results found for: PHOS        Failed - Vitamin D in normal range and within 360 days    Vit D, 25-Hydroxy  Date Value Ref Range Status  07/26/2018 14 (L) 30 - 100 ng/mL Final    Comment:    Vitamin D Status         25-OH Vitamin D: . Deficiency:                    <20 ng/mL Insufficiency:             20 - 29 ng/mL Optimal:                 > or = 30 ng/mL . For 25-OH Vitamin D testing on patients on  D2-supplementation and patients for whom quantitation  of D2 and D3 fractions is required, the QuestAssureD(TM) 25-OH VIT D, (D2,D3), LC/MS/MS is recommended: order  code [id number] (patients >63yrs). . For more information on this test, go to: http://education.quest[facility].com/faq/FAQ163 (This link is being provided for  informational/educational purposes only.)           Passed - Ca in normal range and within 360 days    Calcium  Date Value Ref Range Status  11/08/2018 9.6 8.6 - 10.2 mg/dL Final          Passed - Valid encounter within last 12 months    Recent Outpatient Visits           4 months ago Mild intermittent asthma without complication   [facility] Steele Sizer, MD   6 months ago Routine screening for STI (sexually transmitted infection)   [facility] Steele Sizer, MD   7 months ago Well woman exam   [facility] Steele Sizer, MD   10 months ago [facility] Steele Sizer, MD   11 months ago [place], [place]       Future Appointments             In 1 month Steele Sizer, MD [facility], [facility]

## 2019-07-21 ENCOUNTER — Other Ambulatory Visit: Payer: Self-pay | Admitting: Family Medicine

## 2019-07-21 DIAGNOSIS — J4521 Mild intermittent asthma with (acute) exacerbation: Secondary | ICD-10-CM

## 2019-08-07 ENCOUNTER — Ambulatory Visit: Payer: 59 | Admitting: Family Medicine

## 2019-08-14 ENCOUNTER — Ambulatory Visit: Payer: Self-pay | Admitting: Family Medicine

## 2019-08-30 ENCOUNTER — Encounter: Payer: Self-pay | Admitting: Family Medicine

## 2019-08-30 ENCOUNTER — Ambulatory Visit (INDEPENDENT_AMBULATORY_CARE_PROVIDER_SITE_OTHER): Payer: Self-pay | Admitting: Family Medicine

## 2019-08-30 ENCOUNTER — Other Ambulatory Visit: Payer: Self-pay

## 2019-08-30 ENCOUNTER — Other Ambulatory Visit (HOSPITAL_COMMUNITY)
Admission: RE | Admit: 2019-08-30 | Discharge: 2019-08-30 | Disposition: A | Discharge status: Home / Self Care | Payer: 59 | Source: Ambulatory Visit | Attending: Family Medicine | Admitting: Family Medicine

## 2019-08-30 VITALS — BP 110/68 | HR 93 | Temp 97.5°F | Resp 16 | Ht 60.0 in | Wt 180.3 lb

## 2019-08-30 DIAGNOSIS — E8881 Metabolic syndrome: Secondary | ICD-10-CM

## 2019-08-30 DIAGNOSIS — Z113 Encounter for screening for infections with a predominantly sexual mode of transmission: Secondary | ICD-10-CM | POA: Insufficient documentation

## 2019-08-30 DIAGNOSIS — R739 Hyperglycemia, unspecified: Secondary | ICD-10-CM

## 2019-08-30 DIAGNOSIS — J4520 Mild intermittent asthma, uncomplicated: Secondary | ICD-10-CM

## 2019-08-30 DIAGNOSIS — D75839 Thrombocytosis, unspecified: Secondary | ICD-10-CM

## 2019-08-30 DIAGNOSIS — Z8639 Personal history of other endocrine, nutritional and metabolic disease: Secondary | ICD-10-CM

## 2019-08-30 DIAGNOSIS — E538 Deficiency of other specified B group vitamins: Secondary | ICD-10-CM

## 2019-08-30 DIAGNOSIS — D473 Essential (hemorrhagic) thrombocythemia: Secondary | ICD-10-CM

## 2019-08-30 DIAGNOSIS — E559 Vitamin D deficiency, unspecified: Secondary | ICD-10-CM

## 2019-08-30 DIAGNOSIS — Z7189 Other specified counseling: Secondary | ICD-10-CM

## 2019-08-30 MED ORDER — FOLIC ACID 1 MG PO TABS: 1.0000 mg | ORAL_TABLET | Freq: Every day | ORAL | 1 refills | Status: DC

## 2019-08-30 NOTE — Progress Notes (Signed)
Name: Shelley Davis   MRN: [id number]    DOB: December 27, 1995   Date:08/30/2019       Progress Note  Subjective  Chief Complaint  Chief Complaint  Patient presents with  . Asthma  . Allergic Rhinitis     HPI  Obesity: she has gained 28 lbs in the past two years, she gained 10 lbs since last year. She is doing a weight loss challenge with her mother and sisters since March 21 st and she has gone down 3 lbs since. She is cooking more at home, but she has not been physically active. She stopped drinking sweet tea and is drinking water   Thrombocytosis and iron deficiency: discussed referral to hematologist but she would like to hold off for now, no sob or pica. We will recheck labs  Prenatal counseling: stopped Depo in Nov 2020, she is living with boyfriend past 9 months, they are thinking about getting pregnant. Discussed avoiding drugs, reviewed safe medications, advised starting folic acid .  Hyperglycemia with obesity:we will recheck A1C  Asthma Mild intermittent/AR: no wheezing or cough, , she states recently having allergy symptoms with itchy eyes and also sob in the mornings, she resumed singulair at night, taking xyzal but also needs to take benadryl prn, advised to change to loratadine instead   Palpitation: seen by Dr. Fletcher Anon, avoids caffeine, and no symptoms in a while.    Patient Active Problem List   Diagnosis Date Noted  . Iron deficiency 12/08/2018  . Hyperglycemia 12/08/2018  . Class 1 obesity due to excess calories without serious comorbidity with body mass index (BMI) of 31.0 to 31.9 in adult 07/26/2018  . Asthma, mild intermittent 08/05/2016  . Low serum vitamin B12 08/05/2016  . Primary dysmenorrhea 07/08/2016  . Perennial allergic rhinitis with seasonal variation 12/05/2015  . Allergic to peanuts 12/05/2015  . Vitamin D deficiency 12/05/2015  . Eczema 12/05/2015    Past Surgical History:  Procedure Laterality Date  . WISDOM TOOTH EXTRACTION Bilateral      Family History  Problem Relation Age of Onset  . Hypertension Mother   . Asthma Sister   . Lung disease Maternal Grandmother   . Migraines Sister     Social History   Tobacco Use  . Smoking status: Never Smoker  . Smokeless tobacco: Never Used  Substance Use Topics  . Alcohol use: Yes    Comment: occasionally     Current Outpatient Medications:  .  albuterol (PROVENTIL HFA;VENTOLIN HFA) 108 (90 Base) MCG/ACT inhaler, Inhale 2 puffs into the lungs every 6 (six) hours as needed for wheezing or shortness of breath., Disp: 1 Inhaler, Rfl: 0 .  azelastine (OPTIVAR) 0.05 % ophthalmic solution, Place 2 drops into both eyes 2 (two) times daily., Disp: , Rfl:  .  EPINEPHrine 0.3 mg/0.3 mL IJ SOAJ injection, INJECT 0.3 MLS (0.3 MG TOTAL) INTO THE MUSCLE ONCE., Disp: 2 mL, Rfl: 1 .  famotidine (PEPCID) 20 MG tablet, Take 1 tablet (20 mg total) by mouth 2 (two) times daily., Disp: 180 tablet, Rfl: 1 .  ketoconazole (NIZORAL) 2 % shampoo, APPLY TOPICALLY 2 (TWO) TIMES A WEEK., Disp: 120 mL, Rfl: 2 .  levocetirizine (XYZAL) 5 MG tablet, Take 1 tablet (5 mg total) by mouth every evening., Disp: 90 tablet, Rfl: 1 .  montelukast (SINGULAIR) 10 MG tablet, Take 1 tablet (10 mg total) by mouth at bedtime., Disp: 90 tablet, Rfl: 1 .  Vitamin D, Ergocalciferol, (DRISDOL) 1.25 MG (50000 UNIT) CAPS capsule,  TAKE 1 CAPSULE (50,000 UNITS TOTAL) BY MOUTH EVERY 7 (SEVEN) DAYS., Disp: 12 capsule, Rfl: 0 .  folic acid (FOLVITE) 1 MG tablet, Take 1 tablet (1 mg total) by mouth daily., Disp: 100 tablet, Rfl: 1  Allergies  Allergen Reactions  . Peanut Oil   . Peanut-Containing Drug Products Itching and Other (See Comments)    Throat swelling and becomes itchy     I personally reviewed active problem list, medication list, allergies, family history, social history, health maintenance with the patient/caregiver today.   ROS  Constitutional: Negative for fever, positive for  weight change.   Respiratory: Negative for cough and shortness of breath.   Cardiovascular: Negative for chest pain or palpitations.  Gastrointestinal: Negative for abdominal pain, no bowel changes.  Musculoskeletal: Negative for gait problem or joint swelling.  Skin: Negative for rash.  Neurological: Negative for dizziness or headache.  No other specific complaints in a complete review of systems (except as listed in HPI above).  Objective  Vitals:   08/30/19 1410  BP: 110/68  Pulse: 93  Resp: 16  Temp: (!) 97.5 F (36.4 C)  TempSrc: Temporal  SpO2: 98%  Weight: 180 lb 4.8 oz (81.8 kg)  Height: 5' (1.524 m)    Body mass index is 35.21 kg/m.  Physical Exam  Constitutional: Patient appears well-developed and well-nourished. Obese  No distress.  HEENT: head atraumatic, normocephalic, pupils equal and reactive to light Cardiovascular: Normal rate, regular rhythm and normal heart sounds.  No murmur heard. No BLE edema. Pulmonary/Chest: Effort normal and breath sounds normal. No respiratory distress. Abdominal: Soft.  There is no tenderness. Psychiatric: Patient has a normal mood and affect. behavior is normal. Judgment and thought content normal.  PHQ2/9: Depression screen [facility] 2/9 08/30/2019 02/06/2019 12/08/2018 11/08/2018 08/26/2018  Decreased Interest 0 0 0 0 0  Down, Depressed, Hopeless 0 0 0 0 0  PHQ - 2 Score 0 0 0 0 0  Altered sleeping 0 0 0 0 0  Tired, decreased energy 0 0 0 0 0  Change in appetite 0 0 0 0 0  Feeling bad or failure about yourself  0 0 0 0 0  Trouble concentrating 0 0 0 0 0  Moving slowly or fidgety/restless 0 0 0 0 0  Suicidal thoughts 0 0 0 0 0  PHQ-9 Score 0 0 0 0 0  Difficult doing work/chores - - Not difficult at all Not difficult at all Not difficult at all  Some recent data might be hidden    phq 9 is negative   Fall Risk: Fall Risk  08/30/2019 02/06/2019 12/08/2018 11/08/2018 08/26/2018  Falls in the past year? 0 1 0 0 0  Number falls in past yr: 0 1 0 0 0   Injury with Fall? 0 1 0 0 0    Functional Status Survey: Is the patient deaf or have difficulty hearing?: No Does the patient have difficulty seeing, even when wearing glasses/contacts?: No Does the patient have difficulty concentrating, remembering, or making decisions?: No Does the patient have difficulty walking or climbing stairs?: No Does the patient have difficulty dressing or bathing?: No Does the patient have difficulty doing errands alone such as visiting a doctor's office or shopping?: No    Assessment & Plan  1. Mild intermittent asthma without complication   2. hyperglycemia   3. Vitamin D deficiency  - VITAMIN D 25 Hydroxy (Vit-D Deficiency, Fractures)  4. Insulin resistance  - COMPLETE METABOLIC PANEL WITH GFR  5.  Thrombocytosis (HCC)  - CBC with Differential/Platelet  6. Low serum vitamin B12  - CBC with Differential/Platelet - Vitamin B12  7. Routine screening for STI (sexually transmitted infection)  - RPR - HIV Antibody (routine testing w rflx) - Cervicovaginal ancillary only  8. History of iron deficiency  - Iron, TIBC and Ferritin Panel  9. Prenatal consult  - folic acid (FOLVITE) 1 MG tablet; Take 1 tablet (1 mg total) by mouth daily.  Dispense: 100 tablet; Refill: 1

## 2019-08-31 LAB — CBC WITH DIFFERENTIAL/PLATELET
Absolute Monocytes: 527 cells/uL (ref 200–950)
Basophils Absolute: 33 cells/uL (ref 0–200)
Basophils Relative: 0.5 %
Eosinophils Absolute: 163 cells/uL (ref 15–500)
Eosinophils Relative: 2.5 %
HCT: 37 % (ref 35.0–45.0)
Hemoglobin: 12.2 g/dL (ref 11.7–15.5)
Lymphs Abs: 3016 cells/uL (ref 850–3900)
MCH: 25.8 pg — ABNORMAL LOW (ref 27.0–33.0)
MCHC: 33 g/dL (ref 32.0–36.0)
MCV: 78.4 fL — ABNORMAL LOW (ref 80.0–100.0)
MPV: 8.8 fL (ref 7.5–12.5)
Monocytes Relative: 8.1 %
Neutro Abs: 2763 cells/uL (ref 1500–7800)
Neutrophils Relative %: 42.5 %
Platelets: 409 10*3/uL — ABNORMAL HIGH (ref 140–400)
RBC: 4.72 10*6/uL (ref 3.80–5.10)
RDW: 13.1 % (ref 11.0–15.0)
Total Lymphocyte: 46.4 %
WBC: 6.5 10*3/uL (ref 3.8–10.8)

## 2019-08-31 LAB — HIV ANTIBODY (ROUTINE TESTING W REFLEX): HIV 1&2 Ab, 4th Generation: NONREACTIVE

## 2019-08-31 LAB — COMPLETE METABOLIC PANEL WITH GFR
AG Ratio: 1.4 (calc) (ref 1.0–2.5)
ALT: 10 U/L (ref 6–29)
AST: 14 U/L (ref 10–30)
Albumin: 4.2 g/dL (ref 3.6–5.1)
Alkaline phosphatase (APISO): 66 U/L (ref 31–125)
BUN: 15 mg/dL (ref 7–25)
CO2: 28 mmol/L (ref 20–32)
Calcium: 9.5 mg/dL (ref 8.6–10.2)
Chloride: 101 mmol/L (ref 98–110)
Creat: 0.74 mg/dL (ref 0.50–1.10)
GFR, Est African American: 130 mL/min/{1.73_m2} (ref >=60)
GFR, Est Non African American: 113 mL/min/{1.73_m2} (ref >=60)
Globulin: 3 g/dL (calc) (ref 1.9–3.7)
Glucose, Bld: 86 mg/dL (ref 65–99)
Potassium: 4.1 mmol/L (ref 3.5–5.3)
Sodium: 137 mmol/L (ref 135–146)
Total Bilirubin: 0.3 mg/dL (ref 0.2–1.2)
Total Protein: 7.2 g/dL (ref 6.1–8.1)

## 2019-08-31 LAB — VITAMIN D 25 HYDROXY (VIT D DEFICIENCY, FRACTURES): Vit D, 25-Hydroxy: 14 ng/mL — ABNORMAL LOW (ref 30–100)

## 2019-08-31 LAB — IRON,TIBC AND FERRITIN PANEL
%SAT: 12 % (calc) — ABNORMAL LOW (ref 16–45)
Ferritin: 73 ng/mL (ref 16–154)
Iron: 36 ug/dL — ABNORMAL LOW (ref 40–190)
TIBC: 310 mcg/dL (calc) (ref 250–450)

## 2019-08-31 LAB — HEMOGLOBIN A1C
Hgb A1c MFr Bld: 5.5 % of total Hgb (ref <5.7)
Mean Plasma Glucose: 111 (calc)
eAG (mmol/L): 6.2 (calc)

## 2019-08-31 LAB — VITAMIN B12: Vitamin B-12: 488 pg/mL (ref 200–1100)

## 2019-08-31 LAB — RPR: RPR Ser Ql: NONREACTIVE

## 2019-09-04 LAB — CERVICOVAGINAL ANCILLARY ONLY
Bacterial Vaginitis (gardnerella): POSITIVE — AB
Candida Glabrata: NEGATIVE
Candida Vaginitis: NEGATIVE
Chlamydia: NEGATIVE
Comment: NEGATIVE
Comment: NEGATIVE
Comment: NEGATIVE
Comment: NEGATIVE
Comment: NEGATIVE
Comment: NORMAL
Neisseria Gonorrhea: NEGATIVE
Trichomonas: NEGATIVE

## 2019-09-07 ENCOUNTER — Other Ambulatory Visit: Payer: Self-pay | Admitting: Family Medicine

## 2019-09-07 DIAGNOSIS — E559 Vitamin D deficiency, unspecified: Secondary | ICD-10-CM

## 2019-09-07 MED ORDER — VITAMIN D (ERGOCALCIFEROL) 1.25 MG (50000 UNIT) PO CAPS: 50000.0000 [IU] | ORAL_CAPSULE | ORAL | 0 refills | Status: DC

## 2019-11-20 ENCOUNTER — Other Ambulatory Visit: Payer: Self-pay | Admitting: Family Medicine

## 2019-11-20 DIAGNOSIS — J3089 Other allergic rhinitis: Secondary | ICD-10-CM

## 2019-11-20 DIAGNOSIS — J4520 Mild intermittent asthma, uncomplicated: Secondary | ICD-10-CM

## 2019-11-20 DIAGNOSIS — J302 Other seasonal allergic rhinitis: Secondary | ICD-10-CM

## 2019-12-27 ENCOUNTER — Other Ambulatory Visit: Payer: Self-pay | Admitting: Family Medicine

## 2019-12-27 DIAGNOSIS — L210 Seborrhea capitis: Secondary | ICD-10-CM

## 2020-02-18 ENCOUNTER — Other Ambulatory Visit: Payer: Self-pay | Admitting: Family Medicine

## 2020-02-18 DIAGNOSIS — E559 Vitamin D deficiency, unspecified: Secondary | ICD-10-CM

## 2020-02-18 NOTE — Telephone Encounter (Signed)
Requested medication (s) are due for refill today: yes  Requested medication (s) are on the active medication list: yes  Last refill:  09/07/19  Future visit scheduled: yes  Notes to clinic:  med not delegated to NT to RF   Requested Prescriptions  Pending Prescriptions Disp Refills   Vitamin D, Ergocalciferol, (DRISDOL) 1.25 MG (50000 UNIT) CAPS capsule [Pharmacy Med Name: VITAMIN D2 1.25MG (50,000 UNIT)] 12 capsule 0    Sig: Take 1 capsule (50,000 Units total) by mouth every 7 (seven) days.      Endocrinology:  Vitamins - Vitamin D Supplementation Failed - 02/18/2020  8:55 AM      Failed - 50,000 IU strengths are not delegated      Failed - Phosphate in normal range and within 360 days    No results found for: PHOS        Failed - Vitamin D in normal range and within 360 days    Vit D, 25-Hydroxy  Date Value Ref Range Status  08/30/2019 14 (L) 30 - 100 ng/mL Final    Comment:    Vitamin D Status         25-OH Vitamin D: . Deficiency:                    <20 ng/mL Insufficiency:             20 - 29 ng/mL Optimal:                 > or = 30 ng/mL . For 25-OH Vitamin D testing on patients on  D2-supplementation and patients for whom quantitation  of D2 and D3 fractions is required, the [employer](TM) 25-OH VIT D, (D2,D3), LC/MS/MS is recommended: order  code [id number] (patients >64yrs). See Note 1 . Note 1 . For additional information, please refer to  http://education.[facility].com/faq/FAQ199  (This link is being provided for informational/ educational purposes only.)           Passed - Ca in normal range and within 360 days    Calcium  Date Value Ref Range Status  08/30/2019 9.5 8.6 - 10.2 mg/dL Final          Passed - Valid encounter within last 12 months    Recent Outpatient Visits           5 months ago Mild intermittent asthma without complication   [facility] Steele Sizer, MD   1 year ago Mild intermittent asthma without  complication   [facility] Steele Sizer, MD   1 year ago Routine screening for STI (sexually transmitted infection)   [facility] Steele Sizer, MD   1 year ago Well woman exam   [facility] Steele Sizer, MD   1 year ago [facility] Steele Sizer, MD       Future Appointments             In 2 weeks Steele Sizer, MD [facility], [facility]

## 2020-02-20 ENCOUNTER — Encounter: Payer: Self-pay | Admitting: Emergency Medicine

## 2020-02-20 ENCOUNTER — Ambulatory Visit
Admission: EM | Admit: 2020-02-20 | Discharge: 2020-02-20 | Disposition: A | Discharge status: Home / Self Care | Payer: BC Managed Care – PPO | Attending: Emergency Medicine | Admitting: Emergency Medicine

## 2020-02-20 ENCOUNTER — Other Ambulatory Visit: Payer: Self-pay

## 2020-02-20 DIAGNOSIS — R109 Unspecified abdominal pain: Secondary | ICD-10-CM | POA: Insufficient documentation

## 2020-02-20 LAB — URINALYSIS, COMPLETE (UACMP) WITH MICROSCOPIC
Bilirubin Urine: NEGATIVE
Glucose, UA: NEGATIVE mg/dL
Hgb urine dipstick: NEGATIVE
Ketones, ur: NEGATIVE mg/dL
Leukocytes,Ua: NEGATIVE
Nitrite: NEGATIVE
Protein, ur: NEGATIVE mg/dL
RBC / HPF: NONE SEEN RBC/hpf (ref 0–5)
Specific Gravity, Urine: >1.03 — ABNORMAL HIGH (ref 1.005–1.030)
pH: 6 (ref 5.0–8.0)

## 2020-02-20 LAB — PREGNANCY, URINE: Preg Test, Ur: NEGATIVE

## 2020-02-20 MED ORDER — KETOROLAC TROMETHAMINE 60 MG/2ML IM SOLN
60.0000 mg | Freq: Once | INTRAMUSCULAR | Status: AC
  Administered 2020-02-20: 60 mg via INTRAMUSCULAR

## 2020-02-20 NOTE — ED Provider Notes (Signed)
[facility]    CSN: [id number] Arrival date & time: 02/20/20  1355      History   Chief Complaint Chief Complaint  Patient presents with  . Abdominal Pain    HPI Shelley Davis is a 24 y.o. female.   The history is provided by the patient.  Abdominal Pain Pain location:  LLQ, RLQ and suprapubic Pain quality: pressure   Pain radiates to:  Does not radiate Pain severity:  Moderate Onset quality:  Sudden Timing:  Intermittent Progression:  Waxing and waning Chronicity:  New Context: not laxative use, not sick contacts, not suspicious food intake and not trauma   Relieved by:  None tried Worsened by:  Nothing Ineffective treatments:  None tried Associated symptoms: constipation   Associated symptoms: no dysuria, no hematuria, no nausea, no vaginal bleeding, no vaginal discharge and no vomiting   Risk factors: obesity     Past Medical History:  Diagnosis Date  . Allergy   . Eczema     Patient Active Problem List   Diagnosis Date Noted  . Iron deficiency 12/08/2018  . Hyperglycemia 12/08/2018  . Class 1 obesity due to excess calories without serious comorbidity with body mass index (BMI) of 31.0 to 31.9 in adult 07/26/2018  . Asthma, mild intermittent 08/05/2016  . Low serum vitamin B12 08/05/2016  . Primary dysmenorrhea 07/08/2016  . Perennial allergic rhinitis with seasonal variation 12/05/2015  . Allergic to peanuts 12/05/2015  . Vitamin D deficiency 12/05/2015  . Eczema 12/05/2015    Past Surgical History:  Procedure Laterality Date  . WISDOM TOOTH EXTRACTION Bilateral     OB History    Gravida  0   Para  0   Term  0   Preterm  0   AB  0   Living  0     SAB  0   TAB  0   Ectopic  0   Multiple  0   Live Births  0            Home Medications    Prior to Admission medications   Medication Sig Start Date End Date Taking? Authorizing Provider  Vitamin D, Ergocalciferol, (DRISDOL) 1.25 MG (50000 UNIT) CAPS capsule  TAKE 1 CAPSULE (50,000 UNITS TOTAL) BY MOUTH EVERY 7 (SEVEN) DAYS. 02/19/20   Steele Sizer, MD  albuterol (PROVENTIL HFA;VENTOLIN HFA) 108 (90 Base) MCG/ACT inhaler Inhale 2 puffs into the lungs every 6 (six) hours as needed for wheezing or shortness of breath. 08/29/18   Steele Sizer, MD  azelastine (OPTIVAR) 0.05 % ophthalmic solution Place 2 drops into both eyes 2 (two) times daily.    Mosetta Anis, MD  EPINEPHrine 0.3 mg/0.3 mL IJ SOAJ injection INJECT 0.3 MLS (0.3 MG TOTAL) INTO THE MUSCLE ONCE. 05/24/18   Hubbard Hartshorn, FNP  famotidine (PEPCID) 20 MG tablet Take 1 tablet (20 mg total) by mouth 2 (two) times daily. 07/26/18   Hubbard Hartshorn, FNP  folic acid (FOLVITE) 1 MG tablet Take 1 tablet (1 mg total) by mouth daily. 08/30/19   Sowles, Drue Stager, MD  ketoconazole (NIZORAL) 2 % shampoo APPLY TOPICALLY 2 (TWO) TIMES A WEEK. 12/28/19   Ancil Boozer, Drue Stager, MD  levocetirizine (XYZAL) 5 MG tablet Take 1 tablet (5 mg total) by mouth every evening. 07/26/18   Hubbard Hartshorn, FNP  montelukast (SINGULAIR) 10 MG tablet TAKE 1 TABLET BY MOUTH EVERYDAY AT BEDTIME 11/20/19   Steele Sizer, MD    Family History Family History  Problem Relation Age of Onset  . Hypertension Mother   . Asthma Sister   . Lung disease Maternal Grandmother   . Migraines Sister     Social History Social History   Tobacco Use  . Smoking status: Never Smoker  . Smokeless tobacco: Never Used  Vaping Use  . Vaping Use: Never used  Substance Use Topics  . Alcohol use: Yes    Comment: occasionally  . Drug use: No     Allergies   Peanut oil and Peanut-containing drug products   Review of Systems Review of Systems  Gastrointestinal: Positive for abdominal pain and constipation. Negative for nausea and vomiting.  Genitourinary: Negative for difficulty urinating, dysuria, flank pain, frequency, hematuria, menstrual problem, pelvic pain, vaginal bleeding, vaginal discharge and vaginal pain.  Musculoskeletal: Negative  for back pain.  All other systems reviewed and are negative.    Physical Exam Triage Vital Signs ED Triage Vitals  Enc Vitals Group     BP 02/20/20 1412 120/72     Pulse Rate 02/20/20 1412 100     Resp 02/20/20 1412 18     Temp 02/20/20 1412 98.7 F (37.1 C)     Temp Source 02/20/20 1412 Oral     SpO2 02/20/20 1412 100 %     Weight 02/20/20 1411 167 lb (75.8 kg)     Height 02/20/20 1411 5' (1.524 m)     Head Circumference --      Peak Flow --      Pain Score 02/20/20 1411 8     Pain Loc --      Pain Edu? --      Excl. in [place]? --    No data found.  Updated Vital Signs BP 120/72 (BP Location: Right Arm)   Pulse 100   Temp 98.7 F (37.1 C) (Oral)   Resp 18   Ht 5' (1.524 m)   Wt 167 lb (75.8 kg)   SpO2 100%   BMI 32.61 kg/m    Physical Exam Vitals and nursing note reviewed.  Constitutional:      Appearance: Normal appearance. She is well-developed.  HENT:     Head: Normocephalic.     Right Ear: Tympanic membrane normal.     Left Ear: Tympanic membrane normal.     Nose: Nose normal.     Mouth/Throat:     Pharynx: Uvula midline.  Neck:     Trachea: Trachea normal.  Cardiovascular:     Rate and Rhythm: Normal rate and regular rhythm.     Pulses: Normal pulses.     Heart sounds: Normal heart sounds.  Pulmonary:     Effort: Pulmonary effort is normal.     Breath sounds: Normal breath sounds.  Abdominal:     General: Bowel sounds are normal.     Tenderness: There is abdominal tenderness in the right lower quadrant, suprapubic area and left lower quadrant. There is no right CVA tenderness, left CVA tenderness, guarding or rebound.     Hernia: No hernia is present.  Skin:    General: Skin is warm and dry.  Neurological:     Mental Status: She is alert.     GCS: GCS eye subscore is 4. GCS verbal subscore is 5. GCS motor subscore is 6.     Cranial Nerves: No cranial nerve deficit.     Sensory: No sensory deficit.  Psychiatric:        Attention and Perception:  Attention normal.  Mood and Affect: Mood normal.        Speech: Speech normal.        Behavior: Behavior is cooperative.      UC Treatments / Results  Labs (all labs ordered are listed, but only abnormal results are displayed) Labs Reviewed  URINALYSIS, COMPLETE (UACMP) WITH MICROSCOPIC - Abnormal; Notable for the following components:      Result Value   APPearance HAZY (*)    Specific Gravity, Urine >1.030 (*)    Bacteria, UA RARE (*)    All other components within normal limits  PREGNANCY, URINE    EKG   Radiology No results found.  Procedures Procedures (including critical care time)  Medications Ordered in UC Medications  ketorolac (TORADOL) injection 60 mg (60 mg Intramuscular Given 02/20/20 1436)    Initial Impression / Assessment and Plan / UC Course  I have reviewed the triage vital signs and the nursing notes.  Pertinent labs & imaging results that were available during my care of the patient were reviewed by me and considered in my medical decision making (see chart for details).     Pt reassessed after Toradol, states improved, requesting work note, will provide note for today and tomorrow, if no improvement follow up with ER for additional testing/imaging. DDX: UTI,constipation, muscle strain Final Clinical Impressions(s) / UC Diagnoses   Final diagnoses:  Abdominal pain, unspecified abdominal location     Discharge Instructions     Rest,push fluids, follow up with PCP for recheck. If worsening s/s unable to keep fluids down, develops fever, N,V or worsening pain go to Er for additional testing/imaging. May take OTC meds  as label directed for discomfort.    ED Prescriptions    None     I have reviewed the PDMP during this encounter.   Tori Milks, NP 22/33/61 [id number]

## 2020-02-20 NOTE — Discharge Instructions (Signed)
Rest,push fluids, follow up with PCP for recheck. If worsening s/s unable to keep fluids down, develops fever, N,V or worsening pain go to Er for additional testing/imaging. May take OTC meds  as label directed for discomfort.

## 2020-02-20 NOTE — ED Triage Notes (Signed)
Patient c/o lower abdominal pain that started last night. It hurts when she walks, sits down. Last BM last night, she does report that she was constipated.

## 2020-03-05 ENCOUNTER — Ambulatory Visit (INDEPENDENT_AMBULATORY_CARE_PROVIDER_SITE_OTHER): Payer: BC Managed Care – PPO | Admitting: Family Medicine

## 2020-03-05 ENCOUNTER — Encounter: Payer: Self-pay | Admitting: Family Medicine

## 2020-03-05 ENCOUNTER — Other Ambulatory Visit: Payer: Self-pay

## 2020-03-05 VITALS — BP 136/78 | HR 106 | Temp 98.8°F | Resp 16 | Ht 60.0 in | Wt 178.7 lb

## 2020-03-05 DIAGNOSIS — J4520 Mild intermittent asthma, uncomplicated: Secondary | ICD-10-CM | POA: Diagnosis not present

## 2020-03-05 DIAGNOSIS — E559 Vitamin D deficiency, unspecified: Secondary | ICD-10-CM | POA: Diagnosis not present

## 2020-03-05 DIAGNOSIS — E669 Obesity, unspecified: Secondary | ICD-10-CM

## 2020-03-05 DIAGNOSIS — Z3141 Encounter for fertility testing: Secondary | ICD-10-CM | POA: Diagnosis not present

## 2020-03-05 DIAGNOSIS — R0683 Snoring: Secondary | ICD-10-CM

## 2020-03-05 DIAGNOSIS — E538 Deficiency of other specified B group vitamins: Secondary | ICD-10-CM | POA: Diagnosis not present

## 2020-03-05 DIAGNOSIS — Z91010 Allergy to peanuts: Secondary | ICD-10-CM

## 2020-03-05 MED ORDER — GVOKE HYPOPEN 2-PACK 1 MG/0.2ML ~~LOC~~ SOAJ: 1.0000 mg | Freq: Every day | SUBCUTANEOUS | 1 refills | Status: DC | PRN

## 2020-03-05 MED ORDER — ALBUTEROL SULFATE HFA 108 (90 BASE) MCG/ACT IN AERS: 2.0000 | INHALATION_SPRAY | Freq: Four times a day (QID) | RESPIRATORY_TRACT | 0 refills | Status: AC | PRN

## 2020-03-05 NOTE — Progress Notes (Addendum)
Name: Shelley Davis   MRN: [id number]    DOB: 05-Jul-1995   Date:03/05/2020       Progress Note  Subjective  Chief Complaint  Follow up   HPI   Obesity: she has gained 28 lbs in the past two years, she gained 10 lbs since last year. She is doing a weight loss challenge with her mother and sisters since March 21 st and she has gone down 5 lbs since. She is cooking more at home, she is drinking more water, she joined the gym but has not been going lately  Thrombocytosis and iron deficiency: discussed referral to hematologist but she would like to hold off for now, no sob or pica. Reviewed labs done last year   Peanut allergy: she has an epipen that expired and needs a refill , we will try Gyvok since we have a zero copay card  Prenatal counseling: stopped Depo in Nov 2020, she is living with boyfriend since 2020 , they are thinking about getting pregnant, they have been actively trying to get pregnant since she stopped Depo, it has been almost one year, discussed referral to fertility clinic.   Discussed avoiding drugs, reviewed safe medications, advised starting folic acid .   Hyperglycemia with obesity: A1C has been within normal limits, discussed low carbohydrate diet   Asthma Mild intermittent/AR: no wheezing or cough, she is compliant with medications and denies side effects.   Snoring: she states it is loud snore at night, discussed sleep study versus evaluation by ENT, because of her age we will try ENT first   GERD: resolved since she has been eating earlier , not taking medications anymore   Patient Active Problem List   Diagnosis Date Noted  . Iron deficiency 12/08/2018  . Hyperglycemia 12/08/2018  . Class 1 obesity due to excess calories without serious comorbidity with body mass index (BMI) of 31.0 to 31.9 in adult 07/26/2018  . Asthma, mild intermittent 08/05/2016  . Low serum vitamin B12 08/05/2016  . Primary dysmenorrhea 07/08/2016  . Perennial allergic rhinitis with  seasonal variation 12/05/2015  . Allergic to peanuts 12/05/2015  . Vitamin D deficiency 12/05/2015  . Eczema 12/05/2015    Past Surgical History:  Procedure Laterality Date  . WISDOM TOOTH EXTRACTION Bilateral     Family History  Problem Relation Age of Onset  . Hypertension Mother   . Asthma Sister   . Lung disease Maternal Grandmother   . Migraines Sister     Social History   Tobacco Use  . Smoking status: Never Smoker  . Smokeless tobacco: Never Used  Substance Use Topics  . Alcohol use: Yes    Comment: occasionally     Current Outpatient Medications:  .  albuterol (PROVENTIL HFA;VENTOLIN HFA) 108 (90 Base) MCG/ACT inhaler, Inhale 2 puffs into the lungs every 6 (six) hours as needed for wheezing or shortness of breath., Disp: 1 Inhaler, Rfl: 0 .  EPINEPHrine 0.3 mg/0.3 mL IJ SOAJ injection, INJECT 0.3 MLS (0.3 MG TOTAL) INTO THE MUSCLE ONCE., Disp: 2 mL, Rfl: 1 .  famotidine (PEPCID) 20 MG tablet, Take 1 tablet (20 mg total) by mouth 2 (two) times daily., Disp: 180 tablet, Rfl: 1 .  ketoconazole (NIZORAL) 2 % shampoo, APPLY TOPICALLY 2 (TWO) TIMES A WEEK., Disp: 120 mL, Rfl: 2 .  levocetirizine (XYZAL) 5 MG tablet, Take 1 tablet (5 mg total) by mouth every evening., Disp: 90 tablet, Rfl: 1 .  montelukast (SINGULAIR) 10 MG tablet, TAKE 1 TABLET  BY MOUTH EVERYDAY AT BEDTIME, Disp: 90 tablet, Rfl: 1 .  Vitamin D, Ergocalciferol, (DRISDOL) 1.25 MG (50000 UNIT) CAPS capsule, TAKE 1 CAPSULE (50,000 UNITS TOTAL) BY MOUTH EVERY 7 (SEVEN) DAYS., Disp: 12 capsule, Rfl: 0 .  azelastine (OPTIVAR) 0.05 % ophthalmic solution, Place 2 drops into both eyes 2 (two) times daily. (Patient not taking: Reported on 03/05/2020), Disp: , Rfl:  .  folic acid (FOLVITE) 1 MG tablet, Take 1 tablet (1 mg total) by mouth daily. (Patient not taking: Reported on 03/05/2020), Disp: 100 tablet, Rfl: 1  Allergies  Allergen Reactions  . Peanut Oil   . Peanut-Containing Drug Products Itching and Other (See  Comments)    Throat swelling and becomes itchy     I personally reviewed active problem list, medication list, allergies, family history, social history, health maintenance with the patient/caregiver today.   ROS  Constitutional: Negative for fever or weight change.  Respiratory: Negative for cough and shortness of breath.   Cardiovascular: Negative for chest pain or palpitations.  Gastrointestinal: Negative for abdominal pain, no bowel changes.  Musculoskeletal: Negative for gait problem or joint swelling.  Skin: Negative for rash.  Neurological: Negative for dizziness or headache.  No other specific complaints in a complete review of systems (except as listed in HPI above).  Objective  Vitals:   03/05/20 1552  BP: 136/78  Pulse: (!) 106  Resp: 16  Temp: 98.8 F (37.1 C)  TempSrc: Oral  SpO2: 100%  Weight: 178 lb 11.2 oz (81.1 kg)  Height: 5' (1.524 m)    Body mass index is 34.9 kg/m.  Physical Exam  Constitutional: Patient appears well-developed and well-nourished. Obese  No distress.  HEENT: head atraumatic, normocephalic, pupils equal and reactive to light,  neck supple, tonsils not visualized but has boggy turbinates Cardiovascular: Normal rate, regular rhythm and normal heart sounds.  No murmur heard. No BLE edema. Pulmonary/Chest: Effort normal and breath sounds normal. No respiratory distress. Abdominal: Soft.  There is no tenderness. Psychiatric: Patient has a normal mood and affect. behavior is normal. Judgment and thought content normal.  Recent Results (from the past 2160 hour(s))  Urinalysis, Complete w Microscopic Urine, Clean Catch     Status: Abnormal   Collection Time: 02/20/20  2:27 PM  Result Value Ref Range   Color, Urine YELLOW YELLOW   APPearance HAZY (A) CLEAR   Specific Gravity, Urine >1.030 (H) 1.005 - 1.030   pH 6.0 5.0 - 8.0   Glucose, UA NEGATIVE NEGATIVE mg/dL   Hgb urine dipstick NEGATIVE NEGATIVE   Bilirubin Urine NEGATIVE  NEGATIVE   Ketones, ur NEGATIVE NEGATIVE mg/dL   Protein, ur NEGATIVE NEGATIVE mg/dL   Nitrite NEGATIVE NEGATIVE   Leukocytes,Ua NEGATIVE NEGATIVE   Squamous Epithelial / LPF 11-20 0 - 5   WBC, UA 6-10 0 - 5 WBC/hpf   RBC / HPF NONE SEEN 0 - 5 RBC/hpf   Bacteria, UA RARE (A) NONE SEEN   Amorphous Crystal PRESENT     Comment: Performed at [facility], [address], [place], [place] [address]  Pregnancy, urine     Status: None   Collection Time: 02/20/20  2:28 PM  Result Value Ref Range   Preg Test, Ur NEGATIVE NEGATIVE    Comment: Performed at [facility] [place], [address], [place],  [address]    PHQ2/9: Depression screen [facility] 2/9 03/05/2020 08/30/2019 02/06/2019 12/08/2018 11/08/2018  Decreased Interest 0 0 0 0 0  Down, Depressed, Hopeless  0 0 0 0 0  PHQ - 2 Score 0 0 0 0 0  Altered sleeping - 0 0 0 0  Tired, decreased energy - 0 0 0 0  Change in appetite - 0 0 0 0  Feeling bad or failure about yourself  - 0 0 0 0  Trouble concentrating - 0 0 0 0  Moving slowly or fidgety/restless - 0 0 0 0  Suicidal thoughts - 0 0 0 0  PHQ-9 Score - 0 0 0 0  Difficult doing work/chores - - - Not difficult at all Not difficult at all  Some recent data might be hidden    phq 9 is negative  Fall Risk: Fall Risk  03/05/2020 08/30/2019 02/06/2019 12/08/2018 11/08/2018  Falls in the past year? 0 0 1 0 0  Number falls in past yr: 0 0 1 0 0  Injury with Fall? 0 0 1 0 0     Functional Status Survey: Is the patient deaf or have difficulty hearing?: No Does the patient have difficulty seeing, even when wearing glasses/contacts?: Yes Does the patient have difficulty concentrating, remembering, or making decisions?: No Does the patient have difficulty walking or climbing stairs?: No Does the patient have difficulty dressing or bathing?: No Does the patient have difficulty doing errands alone such as visiting a doctor's office or shopping?: No    Assessment &  Plan  1. Mild intermittent asthma without complication  - albuterol (VENTOLIN HFA) 108 (90 Base) MCG/ACT inhaler; Inhale 2 puffs into the lungs every 6 (six) hours as needed for wheezing or shortness of breath.  Dispense: 1 each; Refill: 0   2. Vitamin D deficiency  Continue supplementation   3. Low serum vitamin B12   4. Encounter for fertility testing  - Ambulatory referral to Obstetrics / Gynecology  5. Snores  - Ambulatory referral to Sleep Studies ESS of 12   6. Obesity (BMI 30.0-34.9)  - Ambulatory referral to Sleep Studies, we can also refer to ENT if she decides to go there instead  Discussed with the patient the risk posed by an increased BMI. Discussed importance of portion control, calorie counting and at least 150 minutes of physical activity weekly. Avoid sweet beverages and drink more water. Eat at least 6 servings of fruit and vegetables daily   7. Peanut allergy  epipen will be sent to pharmacy

## 2020-03-06 ENCOUNTER — Telehealth: Payer: Self-pay

## 2020-03-06 ENCOUNTER — Other Ambulatory Visit: Payer: Self-pay | Admitting: Family Medicine

## 2020-03-06 MED ORDER — EPINEPHRINE 0.3 MG/0.3ML IJ SOAJ: 0.3000 mg | INTRAMUSCULAR | 1 refills | Status: AC | PRN

## 2020-03-06 NOTE — Telephone Encounter (Signed)
Copied from [place] [phone]. Topic: Quick Communication - See Telephone Encounter >> Mar 06, 2020  9:08 AM Loma Boston wrote: CRM for notification. See Telephone encounter for: 03/06/20.Pt needs a cb at [phone] In office yesterday/need supplies for Epi Pen and was given something for insulin, wants to make sure that office has correct item and bring back and exchange after work today wants call back

## 2020-03-27 ENCOUNTER — Ambulatory Visit: Payer: BC Managed Care – PPO | Attending: Neurology

## 2020-03-27 DIAGNOSIS — E669 Obesity, unspecified: Secondary | ICD-10-CM | POA: Insufficient documentation

## 2020-03-27 DIAGNOSIS — R0683 Snoring: Secondary | ICD-10-CM | POA: Insufficient documentation

## 2020-03-27 DIAGNOSIS — Z6834 Body mass index (BMI) 34.0-34.9, adult: Secondary | ICD-10-CM | POA: Insufficient documentation

## 2020-03-29 ENCOUNTER — Other Ambulatory Visit: Payer: Self-pay

## 2020-04-03 ENCOUNTER — Encounter: Payer: Self-pay | Admitting: Obstetrics & Gynecology

## 2020-04-15 ENCOUNTER — Ambulatory Visit (INDEPENDENT_AMBULATORY_CARE_PROVIDER_SITE_OTHER): Payer: BC Managed Care – PPO | Admitting: Obstetrics & Gynecology

## 2020-04-15 ENCOUNTER — Encounter: Payer: Self-pay | Admitting: Obstetrics & Gynecology

## 2020-04-15 ENCOUNTER — Other Ambulatory Visit: Payer: Self-pay

## 2020-04-15 VITALS — BP 120/80 | Ht 60.0 in | Wt 182.0 lb

## 2020-04-15 DIAGNOSIS — N970 Female infertility associated with anovulation: Secondary | ICD-10-CM | POA: Diagnosis not present

## 2020-04-15 NOTE — Progress Notes (Signed)
Gynecology Infertility Exam  PCP: Steele Sizer, MD CC: Infertility  History of Present Illness: Patient is a 24 y.o. G0P0000 presenting for evaluation of infertility. Patient and partner have been attempting conception for 1 year. Marital Status: single but together for > 1year. Pregnancies with current partner no Stopped DEPO 11/2018, periods resumed monthly 03/2019  Menstrual and Endocrine History LMP: Patient's last menstrual period was 03/25/2020. Menarche:9 Interval: 28 days Duration of flow: 4 days Heavy Menses: no Clots: no Intermenstrual Bleeding: no Postcoital Bleeding: no Dysmenorrhea: no Amenorrhea: not applicable Wt Change: no Hirsutism: no Balding: no Acne: no Galactorrhea: no  Obstetrical History Never pregnant  Gynecologic History Last PAP: 10/2018 Previous abdominal or pelvic surgery: no Pelvic Pain:  no Endometriosis: no Hot Flashes: no DES Exposure: no Abnormal Pap: no Cervix Cryo/cone: no STD: no PID: no  Infertility and Endocrine Studies BBT: no Endo. Bx.:no HSG: no PCT: no Laparoscopy: no Hormonal Studies: no Semen analysis: no Other Studies: no Meds: none Other Therapies: Not applicable Insemination:not applicable  Sexual History Frequency: several times per month(s) Satisfied: yes Dyspareunia: no Use of Lubricant: no Douching: no  Contraception None  Family History Thyroid Problems: no Heart Condition or High Blood Pressure: no Blood Clot or Stroke: no Diabetes: no Cancer: no Birth Defects/Inherited diseases:no Infectious diseases (mumps, TB, Rubella):no Other Medical Problems: no MR/autism/fragile X or POF: no  Habits Cigarettes:    Wife -  no    Husband - no Alcohol:    Wife -  no    Husband - no Marijuana: no  PMHx: She  has a past medical history of Allergy and Eczema. Also,  has a past surgical history that includes Wisdom tooth extraction (Bilateral)., family history includes Asthma in her sister;  Hypertension in her mother; Lung disease in her maternal grandmother; Migraines in her sister.,  reports that she has never smoked. She has never used smokeless tobacco. She reports current alcohol use. She reports that she does not use drugs.  She has a current medication list which includes the following prescription(s): albuterol, epinephrine, ketoconazole, levocetirizine, montelukast, and vitamin d (ergocalciferol). Also, is allergic to peanut oil and peanut-containing drug products.  Review of Systems  Constitutional: Negative for chills, fever and malaise/fatigue.  HENT: Negative for congestion, sinus pain and sore throat.   Eyes: Negative for blurred vision and pain.  Respiratory: Negative for cough and wheezing.   Cardiovascular: Negative for chest pain and leg swelling.  Gastrointestinal: Negative for abdominal pain, constipation, diarrhea, heartburn, nausea and vomiting.  Genitourinary: Negative for dysuria, frequency, hematuria and urgency.  Musculoskeletal: Negative for back pain, joint pain, myalgias and neck pain.  Skin: Negative for itching and rash.  Neurological: Negative for dizziness, tremors and weakness.  Endo/Heme/Allergies: Does not bruise/bleed easily.  Psychiatric/Behavioral: Negative for depression. The patient is not nervous/anxious and does not have insomnia.     Objective: BP 120/80   Ht 5' (1.524 m)   Wt 182 lb (82.6 kg)   LMP 03/25/2020   BMI 35.54 kg/m   Next period due 04/21/20 Physical Exam Constitutional:      General: She is not in acute distress.    Appearance: She is well-developed.  Genitourinary:     Pelvic exam was performed with patient supine.     Vagina and uterus normal.     No vaginal erythema or bleeding.     No cervical motion tenderness, discharge, polyp or nabothian cyst.     Uterus is mobile.  Uterus is not enlarged.     No uterine mass detected.    Uterus is midaxial.     No right or left adnexal mass present.     Right  adnexa not tender.     Left adnexa not tender.  HENT:     Head: Normocephalic and atraumatic.     Nose: Nose normal.  Abdominal:     General: There is no distension.     Palpations: Abdomen is soft.     Tenderness: There is no abdominal tenderness.  Musculoskeletal:        General: Normal range of motion.  Neurological:     Mental Status: She is alert and oriented to person, place, and time.     Cranial Nerves: No cranial nerve deficit.  Skin:    General: Skin is warm and dry.  Psychiatric:        Attention and Perception: Attention normal.        Mood and Affect: Mood and affect normal.        Speech: Speech normal.        Behavior: Behavior normal.        Thought Content: Thought content normal.        Judgment: Judgment normal.    Assessment: 24 y.o. G0P0000 1. Infertility associated with anovulation Labs, SA, HSG planned Next level Clomid Third level Dx Lap Fourth level REI eval - TSH - FSH/LH - Anti mullerian hormone - Testosterone,Free and Total - DG Hysterogram (HSG); Future   Plan: 1) We discussed the underlying etiologies which may be implicated in a couple experiencing difficulty conceiving.  The average couple will conceive within the span of 1 year with unprotected coitus, with a monthly fecundity rate of 20% or 1 in 5.  Even without further work up or intervention the patient and her partner may be successful in conceiving unassisted, although if an underlying etiology can be identified and addressed fecundity rate may improve.  The work up entails examining for ovulatory function, tubal patency, and ruling out female factor infertility.  These may be looked at concurrently or sequentially.  The downside of sequential work up is that this method may miss issues if more than one compartment is contributing.  She is aware that tubal factor or moderate to severe female factor infertility will require further consultation with a reproductive endocrinologist.  In the case of  anovulation, use of Clomid (clomiphen citrate) or Femara (letrazole) were discussed with the understanding the the later is an off-label, but well supported use.  With either of these drugs the risk of multiples increases from the standard population rate of 2% to approximately 10%, with higher order multiples possible but unlikely.  Both drugs may require some time to titrate to the appropriate dosage to ensure consistent ovulation.  Cycles will be limited to 6 cycles on each drug secondary to decreasing rates of conception after 6 cycles.  In addition should patient be started on ovulation induction with Clomid she was advised to discontinue the drug for any vision changes as this is a rare but potentially permanent side-effect if medication is continued.  We discussed timing of intercourse as well as the use of ovulation predictor kits identify the patient's fertile window each month.     2) Preconception counseling - immunization up to date.  The patient denies any family history of conditions which would warrant preconception genetic counseling or testing on her or her partner.  Instructed to start prenatal vitamins while  trying to conceive.    Barnett Applebaum, MD, [facility], [facility] 04/15/2020  3:50 PM

## 2020-04-15 NOTE — Patient Instructions (Signed)
Female Infertility  Female infertility refers to a woman's inability to get pregnant (conceive) after a year of having sex regularly (or after 6 months in women over age 26) without using birth control. Infertility can also mean that a woman is not able to carry a pregnancy to full term. Both women and men can have fertility problems. What are the causes? This condition may be caused by:  Problems with reproductive organs. Infertility can result if a woman: ? Has an abnormally short cervix or a cervix that does not remain closed during a pregnancy. ? Has a blockage or scarring in the fallopian tubes. ? Has an abnormally shaped uterus. ? Has uterine fibroids. This is a benign mass of tissue or muscle (tumor) that can develop in the uterus. ? Is not ovulating in a regular way.  Certain medical conditions. These may include: ? Polycystic ovary syndrome (PCOS). This is a hormonal disorder that can cause small cysts to grow on the ovaries. This is the most common cause of infertility in women. ? Endometriosis. This is a condition in which the tissue that lines the uterus (endometrium) grows outside of its normal location. ? Cancer and cancer treatments, such as chemotherapy or radiation. ? Premature ovarian failure. This is when ovaries stop producing eggs and hormones before age 43. ? Sexually transmitted diseases, such as chlamydia or gonorrhea. ? Autoimmune disorders. These are disorders in which the body's defense system (immune system) attacks normal, healthy cells. Infertility can be linked to more than one cause. For some women, the cause of infertility is not known (unexplained infertility). What increases the risk?  Age. A woman's fertility declines with age, especially after her mid-62s.  Being underweight or overweight.  Drinking too much alcohol.  Using drugs such as anabolic steroids, cocaine, and marijuana.  Exercising excessively.  Being exposed to environmental toxins,  such as radiation, pesticides, and certain chemicals. What are the signs or symptoms? The main sign of infertility in women is the inability to get pregnant or carry a pregnancy to full term. How is this diagnosed? This condition may be diagnosed by:  Checking whether you are ovulating each month. The tests may include: ? Blood tests to check hormone levels. ? An ultrasound of the ovaries. ? Taking a small tissue that lines the uterus and checking it under a microscope (endometrial biopsy).  Doing additional tests. This is done if ovulation is normal. Tests may include: ? Hysterosalpingography. This X-ray test can show the shape of the uterus and whether the fallopian tubes are open. ? Laparoscopy. This test uses a lighted tube (laparoscope) to look for problems in the fallopian tubes and other organs. ? Transvaginal ultrasound. This imaging test is used to check for abnormalities in the uterus and ovaries. ? Hysteroscopy. This test uses a lighted tube to check for problems in the cervix and the uterus. To be diagnosed with infertility, both partners will have a physical exam. Both partners will also have an extensive medical and sexual history taken. Additional tests may be done. How is this treated? Treatment depends on the cause of infertility. Most cases of infertility in women are treated with medicine or surgery.  Women may take medicine to: ? Correct ovulation problems. ? Treat other health conditions.  Surgery may be done to: ? Repair damage to the ovaries, fallopian tubes, cervix, or uterus. ? Remove growths from the uterus. ? Remove scar tissue from the uterus, pelvis, or other organs. Assisted reproductive technology (ART) Assisted reproductive technology (  ART) refers to all treatments and procedures that combine eggs and sperm outside the body to try to help a couple conceive. ART is often combined with fertility drugs to stimulate ovulation. Sometimes ART is done using eggs  retrieved from another woman's body (donor eggs) or from previously frozen fertilized eggs (embryos). There are different types of ART. These include:  Intrauterine insemination (IUI). A long, thin tube is used to place sperm directly into a woman's uterus. This procedure: ? Is effective for infertility caused by sperm problems, including low sperm count and low motility. ? Can be used in combination with fertility drugs.  In vitro fertilization (IVF). This is done when a woman's fallopian tubes are blocked or when a man has low sperm count. In this procedure: ? Fertility drugs are used to stimulate the ovaries to produce multiple eggs. ? Once mature, these eggs are removed from the body and combined with the sperm to be fertilized. ? The fertilized eggs are then placed into the woman's uterus. Follow these instructions at home:  Take over-the-counter and prescription medicines only as told by your health care provider.  Do not use any products that contain nicotine or tobacco, such as cigarettes and e-cigarettes. If you need help quitting, ask your health care provider.  If you drink alcohol, limit how much you have to 1 drink a day.  Make dietary changes to lose weight or maintain a healthy weight. Work with your health care provider and a dietitian to set a weight-loss goal that is healthy and reasonable for you.  Seek support from a counselor or support group to talk about your concerns related to infertility. Couples counseling may be helpful for you and your partner.  Practice stress reduction techniques that work well for you, such as regular physical activity, meditation, or deep breathing.  Keep all follow-up visits as told by your health care provider. This is important. Contact a health care provider if you:  Feel that stress is interfering with your life and relationships.  Have side effects from treatments for infertility. Summary  Female infertility refers to a woman's  inability to get pregnant (conceive) after a year of having sex regularly (or after 6 months in women over age 62) without using birth control.  To be diagnosed with infertility, both partners will have a physical exam. Both partners will also have an extensive medical and sexual history taken.  Seek support from a counselor or support group to talk about your concerns related to infertility. Couples counseling may be helpful for you and your partner. This information is not intended to replace advice given to you by your health care provider. Make sure you discuss any questions you have with your health care provider. Document Revised: 09/01/2018 Document Reviewed: 04/12/2017 Elsevier Patient Education  [employer].

## 2020-04-16 ENCOUNTER — Telehealth: Payer: Self-pay | Admitting: Obstetrics & Gynecology

## 2020-04-16 NOTE — Telephone Encounter (Signed)
-----   Message from Gae Dry, MD sent at 04/15/2020  3:43 PM EST ----- Regarding: HSG Sch HSG Dec 2 plz

## 2020-04-16 NOTE — Telephone Encounter (Signed)
Pt rtn'd call and I adv that HSG is scheduled for 12/2 - arr at 1:00, [place], [facility] - 1:30 appt

## 2020-04-16 NOTE — Telephone Encounter (Signed)
Left a message for the patient to return the call.  

## 2020-04-21 LAB — TSH: TSH: 2 u[IU]/mL (ref 0.450–4.500)

## 2020-04-21 LAB — FSH/LH
FSH: 5.6 m[IU]/mL
LH: 11.6 m[IU]/mL

## 2020-04-21 LAB — TESTOSTERONE,FREE AND TOTAL
Testosterone, Free: 7.8 pg/mL — ABNORMAL HIGH (ref 0.0–4.2)
Testosterone: 27 ng/dL (ref 13–71)

## 2020-04-21 LAB — ANTI MULLERIAN HORMONE: ANTI-MULLERIAN HORMONE (AMH): 2.12 ng/mL

## 2020-04-25 ENCOUNTER — Ambulatory Visit: Payer: BC Managed Care – PPO

## 2020-05-01 ENCOUNTER — Ambulatory Visit: Admission: RE | Admit: 2020-05-01 | Payer: BC Managed Care – PPO | Source: Ambulatory Visit

## 2020-05-01 ENCOUNTER — Telehealth: Payer: Self-pay | Admitting: Obstetrics & Gynecology

## 2020-05-01 NOTE — Telephone Encounter (Signed)
[place] office rec'd call from [facility] regarding pt scheduled for HSG today, 05/01/20 w Kenton Kingfisher.   I call back to adv that pt was scheduled w Kenton Kingfisher on 12/2 for HSG - per [facility] instruction to me. Apparently pt called to cancel because she was on day 4 of cycle and still having some light bleeding. So pt changed her appt to 12/8 w [facility]. I adv that Dr Kenton Kingfisher is not even in this week. Also we schedule HSGs within a window  She states she was told that this is normally scheduled on day 10 of cycle. 05/01/20 was her day 10. I adv that this is a procedure that I have to schedule as it has to align with several factors - provider's schedule, [facility] room availability and pt menstrual cycle. I adv pt to call me on the 1st day of her next cycle or the Monday if she starts over the weekend. We can then determine if she can be scheduled this cycle.  Pt stated that she is regular and should have day 1 on 06/15/20. I will look at rescheduling her for 06/18/20 Kenton Kingfisher OR day).

## 2020-05-08 ENCOUNTER — Other Ambulatory Visit: Payer: Self-pay | Admitting: Family Medicine

## 2020-05-08 DIAGNOSIS — E559 Vitamin D deficiency, unspecified: Secondary | ICD-10-CM

## 2020-05-08 NOTE — Telephone Encounter (Signed)
Requested medication (s) are due for refill today:yes  Requested medication (s) are on the active medication list: yes  Last refill: 02/19/20  #12  0 refills  Future visit scheduled yes 07/31/20  Notes to clinic: not delegated  Requested Prescriptions  Pending Prescriptions Disp Refills   Vitamin D, Ergocalciferol, (DRISDOL) 1.25 MG (50000 UNIT) CAPS capsule [Pharmacy Med Name: VITAMIN D2 1.25MG (50,000 UNIT)] 12 capsule 0    Sig: TAKE 1 CAPSULE (50,000 UNITS TOTAL) BY MOUTH EVERY 7 (SEVEN) DAYS.      Endocrinology:  Vitamins - Vitamin D Supplementation Failed - 05/08/2020  1:06 AM      Failed - 50,000 IU strengths are not delegated      Failed - Phosphate in normal range and within 360 days    No results found for: PHOS        Failed - Vitamin D in normal range and within 360 days    Vit D, 25-Hydroxy  Date Value Ref Range Status  08/30/2019 14 (L) 30 - 100 ng/mL Final    Comment:    Vitamin D Status         25-OH Vitamin D: . Deficiency:                    <20 ng/mL Insufficiency:             20 - 29 ng/mL Optimal:                 > or = 30 ng/mL . For 25-OH Vitamin D testing on patients on  D2-supplementation and patients for whom quantitation  of D2 and D3 fractions is required, the [employer]ureD(TM) 25-OH VIT D, (D2,D3), LC/MS/MS is recommended: order  code [phone] (patients >54yrs). See Note 1 . Note 1 . For additional information, please refer to  http://education.Quest[facility]ostics.com/faq/FAQ199  (This link is being provided for informational/ educational purposes only.)           Passed - Ca in normal range and within 360 days    Calcium  Date Value Ref Range Status  08/30/2019 9.5 8.6 - 10.2 mg/dL Final          Passed - Valid encounter within last 12 months    Recent Outpatient Visits           2 months ago Mild intermittent asthma without complication   [facility], Drue Stager, MD   8 months ago Mild intermittent asthma  without complication   [facility] Steele Sizer, MD   1 year ago Mild intermittent asthma without complication   [facility] Steele Sizer, MD   1 year ago Routine screening for STI (sexually transmitted infection)   [facility] Steele Sizer, MD   1 year ago Well woman exam   [facility] Steele Sizer, MD       Future Appointments             In 2 months Ancil Boozer, Drue Stager, MD [facility]

## 2020-05-21 ENCOUNTER — Telehealth: Payer: Self-pay | Admitting: Obstetrics & Gynecology

## 2020-05-21 NOTE — Telephone Encounter (Signed)
Pt called to adv that her 1st day of LMP was yesterday 05/20/20. I was able to schedule for HSG on 05/28/20 @ 1:30 w Tiburcio Pea.   Adv pt to arv at 1:00 to check in

## 2020-05-28 ENCOUNTER — Other Ambulatory Visit: Payer: Self-pay

## 2020-05-28 ENCOUNTER — Ambulatory Visit
Admission: RE | Admit: 2020-05-28 | Discharge: 2020-05-28 | Disposition: A | Discharge status: Home / Self Care | Payer: BC Managed Care – PPO | Source: Ambulatory Visit | Attending: Obstetrics & Gynecology | Admitting: Obstetrics & Gynecology

## 2020-05-28 DIAGNOSIS — N970 Female infertility associated with anovulation: Secondary | ICD-10-CM | POA: Diagnosis not present

## 2020-05-28 MED ORDER — IOHEXOL 300 MG/ML  SOLN
10.0000 mL | Freq: Once | INTRAMUSCULAR | Status: AC
  Administered 2020-05-28: 10 mL

## 2020-05-29 ENCOUNTER — Other Ambulatory Visit: Payer: Self-pay | Admitting: Obstetrics & Gynecology

## 2020-05-29 NOTE — Progress Notes (Unsigned)
PROCEDURE NOTE - HSG - 05/28/2020  Prep and drape done.  Cervix normal.  Tenaculum placed. Cervix dilated minimally.  Catheter placed into uterine cavity. Approximately 30 mL dye injected under fluoroscopic visualization.  Patent bilateral fallopian tube seen.  Catheter and tenaculum removed.  Pt stable, tolerated procedure well.  Annamarie Major, MD, [facility], [facility] 05/29/2020  7:24 AM

## 2020-05-29 NOTE — Procedures (Signed)
Prep and drape done.  Cervix normal.  Tenaculum placed. Cervix dilated minimally.  Catheter placed into uterine cavity. Approximately 20 mL dye injected under fluoroscopic visualization.  Patent bilateral fallopian tube seen.  Catheter and tenaculum removed.  Pt stable, tolerated procedure well.  Annamarie Major, MD, [facility], [facility] 05/29/2020  7:27 AM

## 2020-07-25 ENCOUNTER — Encounter: Payer: Self-pay | Admitting: Obstetrics & Gynecology

## 2020-07-25 ENCOUNTER — Other Ambulatory Visit: Payer: Self-pay

## 2020-07-25 ENCOUNTER — Ambulatory Visit (INDEPENDENT_AMBULATORY_CARE_PROVIDER_SITE_OTHER): Payer: BC Managed Care – PPO | Admitting: Obstetrics & Gynecology

## 2020-07-25 VITALS — BP 120/80 | Ht 60.0 in | Wt 174.0 lb

## 2020-07-25 DIAGNOSIS — E282 Polycystic ovarian syndrome: Secondary | ICD-10-CM | POA: Diagnosis not present

## 2020-07-25 DIAGNOSIS — N970 Female infertility associated with anovulation: Secondary | ICD-10-CM

## 2020-07-25 MED ORDER — CLOMIPHENE CITRATE 50 MG PO TABS: 50.0000 mg | ORAL_TABLET | Freq: Every day | ORAL | 0 refills | Status: DC

## 2020-07-25 NOTE — Patient Instructions (Signed)
Clomiphene tablets What is this medicine? CLOMIPHENE (KLOE mi feen) is a fertility drug that increases the chance of pregnancy. It helps women ovulate (produce a mature egg) during their cycle. This medicine may be used for other purposes; ask your health care provider or pharmacist if you have questions. COMMON BRAND NAME(S): Clomid, Serophene What should I tell my health care provider before I take this medicine? They need to know if you have any of these conditions:  adrenal gland disease  blood vessel disease or blood clots  cyst on the ovary  endometriosis  liver disease  ovarian cancer  pituitary gland disease  vaginal bleeding that has not been evaluated  an unusual or allergic reaction to clomiphene, other medicines, foods, dyes, or preservatives  pregnant (should not be used if you are already pregnant)  breast-feeding How should I use this medicine? Take this medicine by mouth with a glass of water. Follow the directions on the prescription label. Take exactly as directed for the exact number of days prescribed. Take your doses at regular intervals. Most women take this medicine for a 5 day period, but the length of treatment may be adjusted. Your doctor will give you a start date for this medication and will give you instructions on proper use. Do not take your medicine more often than directed. Talk to your pediatrician regarding the use of this medicine in children. Special care may be needed. Overdosage: If you think you have taken too much of this medicine contact a poison control center or emergency room at once. NOTE: This medicine is only for you. Do not share this medicine with others. What if I miss a dose? If you miss a dose, take it as soon as you can. If it is almost time for your next dose, take only that dose. Do not take double or extra doses. What may interact with this medicine?  herbal or dietary supplements, like blue cohosh, black cohosh,  chasteberry, or DHEA  prasterone This list may not describe all possible interactions. Give your health care provider a list of all the medicines, herbs, non-prescription drugs, or dietary supplements you use. Also tell them if you smoke, drink alcohol, or use illegal drugs. Some items may interact with your medicine. What should I watch for while using this medicine? Make sure you understand how and when to use this medicine. You need to know when you are ovulating and when to have sexual intercourse. This will increase the chance of a pregnancy. Visit your doctor or health care professional for regular checks on your progress. You may need tests to check the hormone levels in your blood or you may have to use home-urine tests to check for ovulation. Try to keep any appointments. Compared to other fertility treatments, this medicine does not greatly increase your chances of having multiple babies. An increased chance of having twins may occur in roughly 5 out of every 100 women who take this medication. Stop taking this medicine at once and contact your doctor or health care professional if you think you are pregnant. This medicine is not for long-term use. Most women that benefit from this medicine do so within the first three cycles (months). Your doctor or health care professional will monitor your condition. This medicine is usually used for a total of 6 cycles of treatment. You may get drowsy or dizzy. Do not drive, use machinery, or do anything that needs mental alertness until you know how this drug affects you. Do  not stand or sit up quickly. This reduces the risk of dizzy or fainting spells. Drinking alcoholic beverages or smoking tobacco may decrease your chance of becoming pregnant. Limit or stop alcohol and tobacco use during your fertility treatments. What side effects may I notice from receiving this medicine? Side effects that you should report to your doctor or health care professional  as soon as possible:  allergic reactions like skin rash, itching or hives, swelling of the face, lips, or tongue  breathing problems  changes in vision  fluid retention  nausea, vomiting  pelvic pain or bloating  severe abdominal pain  sudden weight gain Side effects that usually do not require medical attention (report to your doctor or health care professional if they continue or are bothersome):  breast discomfort  hot flashes  mild pelvic discomfort  mild nausea This list may not describe all possible side effects. Call your doctor for medical advice about side effects. You may report side effects to FDA at 1-800-FDA-1088. Where should I keep my medicine? Keep out of the reach of children. Store at room temperature between 15 and 30 degrees C (59 and 86 degrees F). Protect from heat, light, and moisture. Throw away any unused medicine after the expiration date. NOTE: This sheet is a summary. It may not cover all possible information. If you have questions about this medicine, talk to your doctor, pharmacist, or health care provider.  2021 Elsevier/Gold Standard (2007-08-22 22:21:06)

## 2020-07-25 NOTE — Progress Notes (Signed)
  History of Present Illness:  Shelley Davis is a 25 y.o. who recently underwent work up for infertility.  She presents for discussion of results and plan of management. Results revealed PCOS pattern on labs, normal SA for spouse, and normal paten HSG.  PMHx: She  has a past medical history of Allergy and Eczema. Also,  has a past surgical history that includes Wisdom tooth extraction (Bilateral)., family history includes Asthma in her sister; Hypertension in her mother; Lung disease in her maternal grandmother; Migraines in her sister.,  reports that she has never smoked. She has never used smokeless tobacco. She reports current alcohol use. She reports that she does not use drugs. Current Meds  Medication Sig  . albuterol (VENTOLIN HFA) 108 (90 Base) MCG/ACT inhaler Inhale 2 puffs into the lungs every 6 (six) hours as needed for wheezing or shortness of breath.  . clomiPHENE (CLOMID) 50 MG tablet Take 1 tablet (50 mg total) by mouth daily. Take on days 3-7 after menses start  . EPINEPHrine 0.3 mg/0.3 mL IJ SOAJ injection Inject 0.3 mg into the muscle as needed for anaphylaxis.  Marland Kitchen ketoconazole (NIZORAL) 2 % shampoo APPLY TOPICALLY 2 (TWO) TIMES A WEEK.  Marland Kitchen levocetirizine (XYZAL) 5 MG tablet Take 1 tablet (5 mg total) by mouth every evening.  . montelukast (SINGULAIR) 10 MG tablet TAKE 1 TABLET BY MOUTH EVERYDAY AT BEDTIME  . Vitamin D, Ergocalciferol, (DRISDOL) 1.25 MG (50000 UNIT) CAPS capsule TAKE 1 CAPSULE (50,000 UNITS TOTAL) BY MOUTH EVERY 7 (SEVEN) DAYS.  Marland Kitchen Also, is allergic to peanut oil and peanut-containing drug products..  Review of Systems  All other systems reviewed and are negative.   Physical Exam:  BP 120/80   Ht 5' (1.524 m)   Wt 174 lb (78.9 kg)   LMP 07/11/2020   BMI 33.98 kg/m  Body mass index is 33.98 kg/m. Physical Exam Constitutional:      General: She is not in acute distress.    Appearance: She is well-developed and well-nourished.  Musculoskeletal:         General: Normal range of motion.  Neurological:     Mental Status: She is alert and oriented to person, place, and time.  Skin:    General: Skin is warm and dry.  Psychiatric:        Mood and Affect: Mood and affect normal.  Vitals reviewed.     Assessment:  Problem List Items Addressed This Visit      Genitourinary   Infertility associated with anovulation   Relevant Medications   clomiPHENE (CLOMID) 50 MG tablet    Other Visit Diagnoses    PCOS (polycystic ovarian syndrome)    -  Primary      Plan: Detailed discussion of results today. Options for management discussed. Info provided. At this time, we will plan for Clomid w next period.    Later option of Dx Lap or REI referral discussd.    How to take Clomid, monitoring, OPK testing, and call backs discussed.  She was amenable to this plan   A total of 25 minutes were spent face-to-face with the patient as well as preparation, review, communication, and documentation during this encounter.    Shelley Applebaum, MD, [facility], [facility] 07/25/2020  4:28 PM

## 2020-07-30 NOTE — Progress Notes (Signed)
Name: Shelley Davis   MRN: [id number]    DOB: 1995/06/23   Date:07/31/2020       Progress Note  Subjective  Chief Complaint  Follow Up  HPI  Obesity: she had gained a lot of weight during the pandemic, weight is trending down now. She states planning on going back to the gym next week.   Thrombocytosis and iron deficiency:last levels were stable, recheck next visit   Peanut allergy: she has an epipen .   Prenatal counseling: stopped Depo in Nov 2020, she is living with boyfriend since 2020 , they have actively trying to get pregnant for over one year, she saw Dr. Kenton Kingfisher January 2022 and had multiple tests done and was given Clomid to try to get pregnant on her next cycle. She is taking prenatal vitamin and vitamin D   Hyperglycemia with obesity: A1C has been within normal limits, discussed low carbohydrate diet Weight has been stable.   Asthma Mild intermittent/AR: no wheezing or cough, she is compliant with medications and denies side effects. She denies nasal congestion or rhinorrhea.   Snoring: she states it is loud snore at night, discussed sleep study versus evaluation by ENT. She saw ENT and was cleared but she continues to snore, discussed getting a body pillow and try to sleep on her side   GERD: resolved since she has been eating earlier , she states she does not eat from lunch until about 7-8 pm and after she eats she feels full and nauseated. Discussed smaller portions, not to recline after eating, also to avoid drinking at the same time as she eats.   Patient Active Problem List   Diagnosis Date Noted  . Infertility associated with anovulation 04/15/2020  . Iron deficiency 12/08/2018  . Hyperglycemia 12/08/2018  . Class 1 obesity due to excess calories without serious comorbidity with body mass index (BMI) of 31.0 to 31.9 in adult 07/26/2018  . Asthma, mild intermittent 08/05/2016  . Low serum vitamin B12 08/05/2016  . Primary dysmenorrhea 07/08/2016  . Perennial  allergic rhinitis with seasonal variation 12/05/2015  . Allergic to peanuts 12/05/2015  . Vitamin D deficiency 12/05/2015  . Eczema 12/05/2015    Past Surgical History:  Procedure Laterality Date  . WISDOM TOOTH EXTRACTION Bilateral     Family History  Problem Relation Age of Onset  . Hypertension Mother   . Asthma Sister   . Lung disease Maternal Grandmother   . Migraines Sister     Social History   Tobacco Use  . Smoking status: Never Smoker  . Smokeless tobacco: Never Used  Substance Use Topics  . Alcohol use: Yes    Comment: occasionally     Current Outpatient Medications:  .  albuterol (VENTOLIN HFA) 108 (90 Base) MCG/ACT inhaler, Inhale 2 puffs into the lungs every 6 (six) hours as needed for wheezing or shortness of breath., Disp: 1 each, Rfl: 0 .  clomiPHENE (CLOMID) 50 MG tablet, Take 1 tablet (50 mg total) by mouth daily. Take on days 3-7 after menses start, Disp: 5 tablet, Rfl: 0 .  EPINEPHrine 0.3 mg/0.3 mL IJ SOAJ injection, Inject 0.3 mg into the muscle as needed for anaphylaxis., Disp: 2 mL, Rfl: 1 .  ketoconazole (NIZORAL) 2 % shampoo, APPLY TOPICALLY 2 (TWO) TIMES A WEEK., Disp: 120 mL, Rfl: 2 .  levocetirizine (XYZAL) 5 MG tablet, Take 1 tablet (5 mg total) by mouth every evening., Disp: 90 tablet, Rfl: 1 .  montelukast (SINGULAIR) 10 MG tablet, TAKE  1 TABLET BY MOUTH EVERYDAY AT BEDTIME, Disp: 90 tablet, Rfl: 1 .  Vitamin D, Ergocalciferol, (DRISDOL) 1.25 MG (50000 UNIT) CAPS capsule, TAKE 1 CAPSULE (50,000 UNITS TOTAL) BY MOUTH EVERY 7 (SEVEN) DAYS., Disp: 12 capsule, Rfl: 0  Allergies  Allergen Reactions  . Peanut Oil   . Peanut-Containing Drug Products Itching and Other (See Comments)    Throat swelling and becomes itchy     I personally reviewed active problem list, medication list, allergies, family history, social history, health maintenance with the patient/caregiver today.   ROS  Constitutional: Negative for fever or weight change.   Respiratory: Negative for cough and shortness of breath.   Cardiovascular: Negative for chest pain or palpitations.  Gastrointestinal: Negative for abdominal pain, no bowel changes.  Musculoskeletal: Negative for gait problem or joint swelling.  Skin: Negative for rash.  Neurological: Negative for dizziness or headache.  No other specific complaints in a complete review of systems (except as listed in HPI above).  Objective  Vitals:   07/31/20 1526  BP: 126/82  Pulse: (!) 103  Resp: 16  Temp: 98 F (36.7 C)  TempSrc: Oral  SpO2: 99%  Weight: 176 lb (79.8 kg)  Height: 5' (1.524 m)    Body mass index is 34.37 kg/m.  Physical Exam  Constitutional: Patient appears well-developed and well-nourished. Obese No distress.  HEENT: head atraumatic, normocephalic, pupils equal and reactive to light,neck supple Cardiovascular: Normal rate, regular rhythm and normal heart sounds.  No murmur heard. No BLE edema. Pulmonary/Chest: Effort normal and breath sounds normal. No respiratory distress. Abdominal: Soft.  There is no tenderness. Psychiatric: Patient has a normal mood and affect. behavior is normal. Judgment and thought content normal.  PHQ2/9: Depression screen [facility] 2/9 07/31/2020 03/05/2020 08/30/2019 02/06/2019 12/08/2018  Decreased Interest 0 0 0 0 0  Down, Depressed, Hopeless 0 0 0 0 0  PHQ - 2 Score 0 0 0 0 0  Altered sleeping - - 0 0 0  Tired, decreased energy - - 0 0 0  Change in appetite - - 0 0 0  Feeling bad or failure about yourself  - - 0 0 0  Trouble concentrating - - 0 0 0  Moving slowly or fidgety/restless - - 0 0 0  Suicidal thoughts - - 0 0 0  PHQ-9 Score - - 0 0 0  Difficult doing work/chores - - - - Not difficult at all  Some recent data might be hidden    phq 9 is negative   Fall Risk: Fall Risk  07/31/2020 03/05/2020 08/30/2019 02/06/2019 12/08/2018  Falls in the past year? 0 0 0 1 0  Number falls in past yr: 0 0 0 1 0  Injury with Fall? 0 0 0 1 0      Functional Status Survey: Is the patient deaf or have difficulty hearing?: No Does the patient have difficulty seeing, even when wearing glasses/contacts?: No Does the patient have difficulty concentrating, remembering, or making decisions?: No Does the patient have difficulty walking or climbing stairs?: No Does the patient have difficulty dressing or bathing?: No Does the patient have difficulty doing errands alone such as visiting a doctor's office or shopping?: No    Assessment & Plan  1. Hyperglycemia   2. Obesity (BMI 30.0-34.9)  Discussed with the patient the risk posed by an increased BMI. Discussed importance of portion control, calorie counting and at least 150 minutes of physical activity weekly. Avoid sweet beverages and drink more water. Eat at  least 6 servings of fruit and vegetables daily   3. Vitamin D deficiency  Continue supplementation   4. Low serum vitamin B12  Continue b12 supplementation   5. Peanut allergy   6. Insulin resistance  Discussed low carb diet  7. Mild intermittent asthma without complication   8. Gastroesophageal reflux disease without esophagitis  Discussed life style modification

## 2020-07-31 ENCOUNTER — Other Ambulatory Visit: Payer: Self-pay

## 2020-07-31 ENCOUNTER — Ambulatory Visit (INDEPENDENT_AMBULATORY_CARE_PROVIDER_SITE_OTHER): Payer: BC Managed Care – PPO | Admitting: Family Medicine

## 2020-07-31 ENCOUNTER — Encounter: Payer: Self-pay | Admitting: Family Medicine

## 2020-07-31 VITALS — BP 126/82 | HR 103 | Temp 98.0°F | Resp 16 | Ht 60.0 in | Wt 176.0 lb

## 2020-07-31 DIAGNOSIS — E538 Deficiency of other specified B group vitamins: Secondary | ICD-10-CM | POA: Diagnosis not present

## 2020-07-31 DIAGNOSIS — E559 Vitamin D deficiency, unspecified: Secondary | ICD-10-CM | POA: Diagnosis not present

## 2020-07-31 DIAGNOSIS — E669 Obesity, unspecified: Secondary | ICD-10-CM | POA: Diagnosis not present

## 2020-07-31 DIAGNOSIS — K219 Gastro-esophageal reflux disease without esophagitis: Secondary | ICD-10-CM

## 2020-07-31 DIAGNOSIS — J4520 Mild intermittent asthma, uncomplicated: Secondary | ICD-10-CM

## 2020-07-31 DIAGNOSIS — E8881 Metabolic syndrome: Secondary | ICD-10-CM

## 2020-07-31 DIAGNOSIS — R739 Hyperglycemia, unspecified: Secondary | ICD-10-CM

## 2020-07-31 DIAGNOSIS — Z23 Encounter for immunization: Secondary | ICD-10-CM | POA: Diagnosis not present

## 2020-07-31 DIAGNOSIS — Z91010 Allergy to peanuts: Secondary | ICD-10-CM

## 2020-07-31 NOTE — Patient Instructions (Signed)
Food Choices for Gastroesophageal Reflux Disease, Adult When you have gastroesophageal reflux disease (GERD), the foods you eat and your eating habits are very important. Choosing the right foods can help ease the discomfort of GERD. Consider working with a dietitian to help you make healthy food choices. What are tips for following this plan? Reading food labels  Look for foods that are low in saturated fat. Foods that have less than 5% of daily value (DV) of fat and 0 g of trans fats may help with your symptoms. Cooking  Cook foods using methods other than frying. This may include baking, steaming, grilling, or broiling. These are all methods that do not need a lot of fat for cooking.  To add flavor, try to use herbs that are low in spice and acidity. Meal planning  Choose healthy foods that are low in fat, such as fruits, vegetables, whole grains, low-fat dairy products, lean meats, fish, and poultry.  Eat frequent, small meals instead of three large meals each day. Eat your meals slowly, in a relaxed setting. Avoid bending over or lying down until 2-3 hours after eating.  Limit high-fat foods such as fatty meats or fried foods.  Limit your intake of fatty foods, such as oils, butter, and shortening.  Avoid the following as told by your health care provider: ? Foods that cause symptoms. These may be different for different people. Keep a food diary to keep track of foods that cause symptoms. ? Alcohol. ? Drinking large amounts of liquid with meals. ? Eating meals during the 2-3 hours before bed.   Lifestyle  Maintain a healthy weight. Ask your health care provider what weight is healthy for you. If you need to lose weight, work with your health care provider to do so safely.  Exercise for at least 30 minutes on 5 or more days each week, or as told by your health care provider.  Avoid wearing clothes that fit tightly around your waist and chest.  Do not use any products that  contain nicotine or tobacco. These products include cigarettes, chewing tobacco, and vaping devices, such as e-cigarettes. If you need help quitting, ask your health care provider.  Sleep with the head of your bed raised. Use a wedge under the mattress or blocks under the bed frame to raise the head of the bed.  Chew sugar-free gum after mealtimes. What foods should I eat? Eat a healthy, well-balanced diet of fruits, vegetables, whole grains, low-fat dairy products, lean meats, fish, and poultry. Each person is different. Foods that may trigger symptoms in one person may not trigger any symptoms in another person. Work with your health care provider to identify foods that are safe for you. The items listed above may not be a complete list of recommended foods and beverages. Contact a dietitian for more information.   What foods should I avoid? Limiting some of these foods may help manage the symptoms of GERD. Everyone is different. Consult a dietitian or your health care provider to help you identify the exact foods to avoid, if any. Fruits Any fruits prepared with added fat. Any fruits that cause symptoms. For some people this may include citrus fruits, such as oranges, grapefruit, pineapple, and lemons. Vegetables Deep-fried vegetables. French fries. Any vegetables prepared with added fat. Any vegetables that cause symptoms. For some people, this may include tomatoes and tomato products, chili peppers, onions and garlic, and horseradish. Grains Pastries or quick breads with added fat. Meats and other proteins High-fat   meats, such as fatty beef or pork, hot dogs, ribs, ham, sausage, salami, and bacon. Fried meat or protein, including fried fish and fried chicken. Nuts and nut butters, in large amounts. Dairy Whole milk and chocolate milk. Sour cream. Cream. Ice cream. Cream cheese. Milkshakes. Fats and oils Butter. Margarine. Shortening. Ghee. Beverages Coffee and tea, with or without  caffeine. Carbonated beverages. Sodas. Energy drinks. Fruit juice made with acidic fruits, such as orange or grapefruit. Tomato juice. Alcoholic drinks. Sweets and desserts Chocolate and cocoa. Donuts. Seasonings and condiments Pepper. Peppermint and spearmint. Added salt. Any condiments, herbs, or seasonings that cause symptoms. For some people, this may include curry, hot sauce, or vinegar-based salad dressings. The items listed above may not be a complete list of foods and beverages to avoid. Contact a dietitian for more information. Questions to ask your health care provider Diet and lifestyle changes are usually the first steps that are taken to manage symptoms of GERD. If diet and lifestyle changes do not improve your symptoms, talk with your health care provider about taking medicines. Where to find more information  International [employer] for Gastrointestinal Disorders: aboutgerd.org Summary  When you have gastroesophageal reflux disease (GERD), food and lifestyle choices may be very helpful in easing the discomfort of GERD.  Eat frequent, small meals instead of three large meals each day. Eat your meals slowly, in a relaxed setting. Avoid bending over or lying down until 2-3 hours after eating.  Limit high-fat foods such as fatty meats or fried foods. This information is not intended to replace advice given to you by your health care provider. Make sure you discuss any questions you have with your health care provider. Document Revised: 11/20/2019 Document Reviewed: 11/20/2019 Elsevier Patient Education  [place].

## 2020-08-30 ENCOUNTER — Other Ambulatory Visit: Payer: Self-pay | Admitting: Obstetrics and Gynecology

## 2020-08-30 MED ORDER — CLOMIPHENE CITRATE 50 MG PO TABS
50.0000 mg | ORAL_TABLET | Freq: Every day | ORAL | 0 refills | Status: DC
Start: 2020-08-30 — End: 2020-09-30

## 2020-08-30 NOTE — Telephone Encounter (Signed)
Refill called in. 

## 2020-09-30 ENCOUNTER — Other Ambulatory Visit: Payer: Self-pay | Admitting: Obstetrics & Gynecology

## 2020-09-30 MED ORDER — CLOMIPHENE CITRATE 50 MG PO TABS: 100.0000 mg | ORAL_TABLET | Freq: Every day | ORAL | 0 refills | Status: DC

## 2020-09-30 NOTE — Progress Notes (Signed)
Clomid Follow Up Pt has been on Clomid 50mg  last cycle, which was cycle # 2.  She reports no side effects and Ovulation Predictor Testing Positive on day 13-19 (unsure which).  No other meds, except PNV.  LMP- none yet, now day 32 Prior LMP 08/29/20 Neg preg test  Counseled on plan.    Recheck preg test first    As she has PCOS and often misses period, plan to restart Clomid if neg preg test    Clomid 100mg  this cycle, Rx done.    Cont OPK.    Monitor for cycle/ uCG testing.  Follow up for pos preg test or call with period/neg testing.  Barnett Applebaum, MD, [facility], [facility] 09/30/2020  11:08 AM

## 2020-11-01 ENCOUNTER — Other Ambulatory Visit: Payer: Self-pay | Admitting: Obstetrics & Gynecology

## 2020-11-01 MED ORDER — CLOMIPHENE CITRATE 50 MG PO TABS: 150.0000 mg | ORAL_TABLET | Freq: Every day | ORAL | 1 refills | Status: AC

## 2020-11-01 NOTE — Progress Notes (Signed)
Clomid Follow Up Pt has been on Clomid 100mg  last cycle, which was cycle # 3.  She reports no side effects and Ovulation Predictor Testing Positive.  No other meds, except PNV.  LMP- 11/01/20  Counseled on plan.    Clomid 150mg  this cycle, Rx done.    Cont OPK.    Monitor for cycle/ uCG testing.  Follow up for pos preg test or call with period/neg testing.  Barnett Applebaum, MD, [facility], [facility] 11/01/2020  8:08 AM

## 2021-01-01 ENCOUNTER — Other Ambulatory Visit: Payer: Self-pay | Admitting: Family Medicine

## 2021-01-01 DIAGNOSIS — L210 Seborrhea capitis: Secondary | ICD-10-CM

## 2021-01-01 NOTE — Telephone Encounter (Signed)
Requested medications are due for refill today.  yes  Requested medications are on the active medications list.  yes  Last refill. 12/28/2019  Future visit scheduled.   yes  Notes to clinic.  Prescription is expired.

## 2021-01-02 NOTE — Telephone Encounter (Signed)
Next appt is 01/31/2021

## 2021-01-30 NOTE — Progress Notes (Deleted)
Name: Shelley Davis   MRN: [id number]    DOB: 1996-05-25   Date:01/30/2021       Progress Note  Subjective  Chief Complaint  Follow Up  HPI  Obesity: she had gained a lot of weight during the pandemic, weight is trending down now. She states planning on going back to the gym next week.    Thrombocytosis and iron deficiency:last levels were stable, recheck next visit   Peanut allergy: she has an epipen .   Prenatal counseling: stopped Depo in Nov 2020, she is living with boyfriend since 2020 , they have actively trying to get pregnant for over one year, she saw Dr. Kenton Kingfisher January 2022 and had multiple tests done and was given Clomid to try to get pregnant on her next cycle. She is taking prenatal vitamin and vitamin D   Hyperglycemia with obesity: A1C has been within normal limits, discussed low carbohydrate diet Weight has been stable.    Asthma Mild intermittent/AR: no wheezing or cough, she is compliant with medications and denies side effects. She denies nasal congestion or rhinorrhea.   Snoring: she states it is loud snore at night, discussed sleep study versus evaluation by ENT. She saw ENT and was cleared but she continues to snore, discussed getting a body pillow and try to sleep on her side   GERD: resolved since she has been eating earlier , she states she does not eat from lunch until about 7-8 pm and after she eats she feels full and nauseated. Discussed smaller portions, not to recline after eating, also to avoid drinking at the same time as she eats.   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   Iron deficiency 12/08/2018   Hyperglycemia 12/08/2018   Class 1 obesity due to excess calories without serious comorbidity with body mass index (BMI) of 31.0 to 31.9 in adult 07/26/2018   Asthma, mild intermittent 08/05/2016   Low serum vitamin B12 08/05/2016   Primary dysmenorrhea 07/08/2016   Perennial allergic  rhinitis with seasonal variation 12/05/2015   Allergic to peanuts 12/05/2015   Vitamin D deficiency 12/05/2015   Eczema 12/05/2015    Past Surgical History:  Procedure Laterality Date   WISDOM TOOTH EXTRACTION Bilateral     Family History  Problem Relation Age of Onset   Hypertension Mother    Asthma Sister    Lung disease Maternal Grandmother    Migraines Sister     Social History   Tobacco Use   Smoking status: Never   Smokeless tobacco: Never  Substance Use Topics   Alcohol use: Yes    Comment: occasionally     Current Outpatient Medications:    ketoconazole (NIZORAL) 2 % shampoo, APPLY TOPICALLY 2 (TWO) TIMES A WEEK., Disp: 120 mL, Rfl: 0   albuterol (VENTOLIN HFA) 108 (90 Base) MCG/ACT inhaler, Inhale 2 puffs into the lungs every 6 (six) hours as needed for wheezing or shortness of breath., Disp: 1 each, Rfl: 0   clomiPHENE (CLOMID) 50 MG tablet, Take 3 tablets (150 mg total) by mouth daily., Disp: 15 tablet, Rfl: 1   EPINEPHrine 0.3 mg/0.3 mL IJ SOAJ injection, Inject 0.3 mg into the muscle as needed for anaphylaxis., Disp: 2 mL, Rfl: 1   levocetirizine (XYZAL) 5 MG tablet, Take 1 tablet (5 mg total) by mouth every evening., Disp: 90 tablet, Rfl: 1   montelukast (SINGULAIR) 10 MG tablet, TAKE 1 TABLET BY MOUTH EVERYDAY AT BEDTIME,  Disp: 90 tablet, Rfl: 1   Vitamin D, Ergocalciferol, (DRISDOL) 1.25 MG (50000 UNIT) CAPS capsule, TAKE 1 CAPSULE (50,000 UNITS TOTAL) BY MOUTH EVERY 7 (SEVEN) DAYS., Disp: 12 capsule, Rfl: 0  Allergies  Allergen Reactions   Peanut Oil    Peanut-Containing Drug Products Itching and Other (See Comments)    Throat swelling and becomes itchy     I personally reviewed {Reviewed:14835} with the patient/caregiver today.   ROS  ***  Objective  There were no vitals filed for this visit.  There is no height or weight on file to calculate BMI.  Physical Exam ***  No results found for this or any previous visit (from the past 2160  hour(s)).  Diabetic Foot Exam: Diabetic Foot Exam - Simple   No data filed    ***  PHQ2/9: Depression screen [facility] 2/9 07/31/2020 03/05/2020 08/30/2019 02/06/2019 12/08/2018  Decreased Interest 0 0 0 0 0  Down, Depressed, Hopeless 0 0 0 0 0  PHQ - 2 Score 0 0 0 0 0  Altered sleeping - - 0 0 0  Tired, decreased energy - - 0 0 0  Change in appetite - - 0 0 0  Feeling bad or failure about yourself  - - 0 0 0  Trouble concentrating - - 0 0 0  Moving slowly or fidgety/restless - - 0 0 0  Suicidal thoughts - - 0 0 0  PHQ-9 Score - - 0 0 0  Difficult doing work/chores - - - - Not difficult at all  Some recent data might be hidden    phq 9 is {gen pos NO:[id number] ***  Fall Risk: Fall Risk  07/31/2020 03/05/2020 08/30/2019 02/06/2019 12/08/2018  Falls in the past year? 0 0 0 1 0  Number falls in past yr: 0 0 0 1 0  Injury with Fall? 0 0 0 1 0   ***   Functional Status Survey:   ***   Assessment & Plan  *** There are no diagnoses linked to this encounter.

## 2021-01-31 ENCOUNTER — Ambulatory Visit: Payer: BC Managed Care – PPO | Admitting: Family Medicine

## 2021-02-25 ENCOUNTER — Ambulatory Visit: Payer: BC Managed Care – PPO | Admitting: Family Medicine

## 2021-03-26 ENCOUNTER — Ambulatory Visit: Payer: BC Managed Care – PPO | Admitting: Family Medicine

## 2021-03-26 NOTE — Progress Notes (Deleted)
Name: Shelley Davis   MRN: [id number]    DOB: 01-27-1996   Date:03/26/2021       Progress Note  Subjective  Chief Complaint  Follow up   HPI  Obesity: she had gained a lot of weight during the pandemic, weight is trending down now. She states planning on going back to the gym next week.    Thrombocytosis and iron deficiency:last levels were stable, recheck next visit   Peanut allergy: she has an epipen .   Prenatal counseling: stopped Depo in Nov 2020, she is living with boyfriend since 2020 , they have actively trying to get pregnant for over one year, she saw Dr. Kenton Kingfisher January 2022 and had multiple tests done and was given Clomid to try to get pregnant on her next cycle. She is taking prenatal vitamin and vitamin D   Hyperglycemia with obesity: A1C has been within normal limits, discussed low carbohydrate diet Weight has been stable.    Asthma Mild intermittent/AR: no wheezing or cough, she is compliant with medications and denies side effects. She denies nasal congestion or rhinorrhea.   Snoring: she states it is loud snore at night, discussed sleep study versus evaluation by ENT. She saw ENT and was cleared but she continues to snore, discussed getting a body pillow and try to sleep on her side   GERD: resolved since she has been eating earlier , she states she does not eat from lunch until about 7-8 pm and after she eats she feels full and nauseated. Discussed smaller portions, not to recline after eating, also to avoid drinking at the same time as she eats.  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   Iron deficiency 12/08/2018   Hyperglycemia 12/08/2018   Class 1 obesity due to excess calories without serious comorbidity with body mass index (BMI) of 31.0 to 31.9 in adult 07/26/2018   Asthma, mild intermittent 08/05/2016   Low serum vitamin B12 08/05/2016   Primary dysmenorrhea 07/08/2016   Perennial allergic  rhinitis with seasonal variation 12/05/2015   Allergic to peanuts 12/05/2015   Vitamin D deficiency 12/05/2015   Eczema 12/05/2015    Past Surgical History:  Procedure Laterality Date   WISDOM TOOTH EXTRACTION Bilateral     Family History  Problem Relation Age of Onset   Hypertension Mother    Asthma Sister    Lung disease Maternal Grandmother    Migraines Sister     Social History   Tobacco Use   Smoking status: Never   Smokeless tobacco: Never  Substance Use Topics   Alcohol use: Yes    Comment: occasionally     Current Outpatient Medications:    ketoconazole (NIZORAL) 2 % shampoo, APPLY TOPICALLY 2 (TWO) TIMES A WEEK., Disp: 120 mL, Rfl: 0   albuterol (VENTOLIN HFA) 108 (90 Base) MCG/ACT inhaler, Inhale 2 puffs into the lungs every 6 (six) hours as needed for wheezing or shortness of breath., Disp: 1 each, Rfl: 0   clomiPHENE (CLOMID) 50 MG tablet, Take 3 tablets (150 mg total) by mouth daily., Disp: 15 tablet, Rfl: 1   EPINEPHrine 0.3 mg/0.3 mL IJ SOAJ injection, Inject 0.3 mg into the muscle as needed for anaphylaxis., Disp: 2 mL, Rfl: 1   levocetirizine (XYZAL) 5 MG tablet, Take 1 tablet (5 mg total) by mouth every evening., Disp: 90 tablet, Rfl: 1   montelukast (SINGULAIR) 10 MG tablet, TAKE 1 TABLET BY MOUTH EVERYDAY AT BEDTIME,  Disp: 90 tablet, Rfl: 1   Vitamin D, Ergocalciferol, (DRISDOL) 1.25 MG (50000 UNIT) CAPS capsule, TAKE 1 CAPSULE (50,000 UNITS TOTAL) BY MOUTH EVERY 7 (SEVEN) DAYS., Disp: 12 capsule, Rfl: 0  Allergies  Allergen Reactions   Peanut Oil    Peanut-Containing Drug Products Itching and Other (See Comments)    Throat swelling and becomes itchy     I personally reviewed {Reviewed:14835} with the patient/caregiver today.   ROS  ***  Objective  There were no vitals filed for this visit.  There is no height or weight on file to calculate BMI.  Physical Exam ***  No results found for this or any previous visit (from the past 2160  hour(s)).  Diabetic Foot Exam: Diabetic Foot Exam - Simple   No data filed    ***  PHQ2/9: Depression screen [facility] 2/9 07/31/2020 03/05/2020 08/30/2019 02/06/2019 12/08/2018  Decreased Interest 0 0 0 0 0  Down, Depressed, Hopeless 0 0 0 0 0  PHQ - 2 Score 0 0 0 0 0  Altered sleeping - - 0 0 0  Tired, decreased energy - - 0 0 0  Change in appetite - - 0 0 0  Feeling bad or failure about yourself  - - 0 0 0  Trouble concentrating - - 0 0 0  Moving slowly or fidgety/restless - - 0 0 0  Suicidal thoughts - - 0 0 0  PHQ-9 Score - - 0 0 0  Difficult doing work/chores - - - - Not difficult at all  Some recent data might be hidden    phq 9 is {gen pos SAY:[id number]} ***  Fall Risk: Fall Risk  07/31/2020 03/05/2020 08/30/2019 02/06/2019 12/08/2018  Falls in the past year? 0 0 0 1 0  Number falls in past yr: 0 0 0 1 0  Injury with Fall? 0 0 0 1 0   ***   Functional Status Survey:   ***   Assessment & Plan  *** There are no diagnoses linked to this encounter.

## 2021-05-23 ENCOUNTER — Other Ambulatory Visit: Payer: Self-pay | Admitting: Family Medicine

## 2021-05-23 DIAGNOSIS — L210 Seborrhea capitis: Secondary | ICD-10-CM

## 2021-05-23 NOTE — Telephone Encounter (Signed)
Requested medication (s) are due for refill today: yes  Requested medication (s) are on the active medication list: yes  Last refill:  04/24/21  Future visit scheduled: no  Notes to clinic:  last visit 07/31/20, has 2 NO SHOW appts and one cancellation. Please assess.  Requested Prescriptions  Pending Prescriptions Disp Refills   ketoconazole (NIZORAL) 2 % shampoo [Pharmacy Med Name: KETOCONAZOLE 2% SHAMPOO] 120 mL 0    Sig: APPLY TOPICALLY 2 TIMES A WEEK     Over the Counter:  OTC Passed - 05/23/2021  1:28 AM      Passed - Valid encounter within last 12 months    Recent Outpatient Visits           9 months ago Obesity (BMI 30.0-34.9)   [facility] [place], Drue Stager, MD   1 year ago Mild intermittent asthma without complication   [facility] Steele Sizer, MD   1 year ago Mild intermittent asthma without complication   [facility] [place], Drue Stager, MD   2 years ago Mild intermittent asthma without complication   [facility] Steele Sizer, MD   2 years ago Routine screening for STI (sexually transmitted infection)   [facility] Steele Sizer, MD

## 2021-05-27 ENCOUNTER — Other Ambulatory Visit: Payer: Self-pay | Admitting: Family Medicine

## 2021-05-27 DIAGNOSIS — L210 Seborrhea capitis: Secondary | ICD-10-CM

## 2021-09-17 ENCOUNTER — Other Ambulatory Visit: Payer: Self-pay | Admitting: Family Medicine

## 2021-09-17 DIAGNOSIS — L210 Seborrhea capitis: Secondary | ICD-10-CM

## 2021-10-07 NOTE — Progress Notes (Deleted)
Name: Shelley Davis   MRN: [id number]    DOB: [birth year]   Date:10/07/2021       Progress Note  Subjective  Chief Complaint  No chief complaint on file.   HPI  Patient presents for annual CPE.  Diet: *** Exercise: ***   Flowsheet Row Office Visit from 12/08/2018 in [facility]  AUDIT-C Score 1      Depression: Phq 9 is  {Desc; negative/positive:13464}    07/31/2020    3:25 PM 03/05/2020    3:51 PM 08/30/2019    2:02 PM 02/06/2019    2:27 PM 12/08/2018    8:58 AM  Depression screen PHQ 2/9  Decreased Interest 0 0 0 0 0  Down, Depressed, Hopeless 0 0 0 0 0  PHQ - 2 Score 0 0 0 0 0  Altered sleeping   0 0 0  Tired, decreased energy   0 0 0  Change in appetite   0 0 0  Feeling bad or failure about yourself    0 0 0  Trouble concentrating   0 0 0  Moving slowly or fidgety/restless   0 0 0  Suicidal thoughts   0 0 0  PHQ-9 Score   0 0 0  Difficult doing work/chores     Not difficult at all   Hypertension: BP Readings from Last 3 Encounters:  07/31/20 126/82  07/25/20 120/80  04/15/20 120/80   Obesity: Wt Readings from Last 3 Encounters:  07/31/20 176 lb (79.8 kg)  07/25/20 174 lb (78.9 kg)  04/15/20 182 lb (82.6 kg)   BMI Readings from Last 3 Encounters:  07/31/20 34.37 kg/m  07/25/20 33.98 kg/m  04/15/20 35.54 kg/m     Vaccines:   HPV:  Tdap:  Shingrix:  Pneumonia:  Flu:  COVID-19:   Hep C Screening:  STD testing and prevention (HIV/chl/gon/syphilis):  Intimate partner violence: {Desc; negative/positive:13464} screen  Sexual History : Menstrual History/LMP/Abnormal Bleeding:  Discussed importance of follow up if any post-menopausal bleeding: {Response; yes/no/na:63}  Incontinence Symptoms: {Desc; negative/positive:13464} for symptoms   Breast cancer:  - Last Mammogram: *** - BRCA gene screening:   Osteoporosis Prevention : Discussed high calcium and vitamin D supplementation, weight bearing exercises Bone density :{Response;  yes/no/na:63}   Cervical cancer screening:   Skin cancer: Discussed monitoring for atypical lesions  Colorectal cancer: ***   Lung cancer:  Low Dose CT Chest recommended if Age 33-80 years, 20 pack-year currently smoking OR have quit w/in 15years. Patient {DOES NOT does:27190::"does not"} qualify for screen   ECG: ***  Advanced Care Planning: A voluntary discussion about advance care planning including the explanation and discussion of advance directives.  Discussed health care proxy and Living will, and the patient was able to identify a health care proxy as ***.  Patient {DOES_DOES BCW:88891} have a living will and power of attorney of health care   Lipids: Lab Results  Component Value Date   CHOL 199 11/08/2018   CHOL 184 07/08/2016   Lab Results  Component Value Date   HDL 62 11/08/2018   HDL 70 07/08/2016   Lab Results  Component Value Date   LDLCALC 122 (H) 11/08/2018   LDLCALC 106 (H) 07/08/2016   Lab Results  Component Value Date   TRIG 48 11/08/2018   TRIG 38 07/08/2016   Lab Results  Component Value Date   CHOLHDL 3.2 11/08/2018   CHOLHDL 2.6 07/08/2016   No results found for: LDLDIRECT  Glucose: Glucose, Bld  Date Value Ref Range Status  08/30/2019 86 65 - 99 mg/dL Final    Comment:    .            Fasting reference interval .   11/08/2018 123 (H) 65 - 99 mg/dL Final    Comment:    .            Fasting reference interval . For someone without known diabetes, a glucose value between 100 and 125 mg/dL is consistent with prediabetes and should be confirmed with a follow-up test. .   07/26/2018 93 65 - 99 mg/dL Final    Comment:    .            Fasting reference interval .     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   Iron deficiency 12/08/2018   Hyperglycemia 12/08/2018   Class 1 obesity due to excess calories without serious comorbidity with body mass index (BMI) of 31.0 to  31.9 in adult 07/26/2018   Asthma, mild intermittent 08/05/2016   Low serum vitamin B12 08/05/2016   Primary dysmenorrhea 07/08/2016   Perennial allergic rhinitis with seasonal variation 12/05/2015   Allergic to peanuts 12/05/2015   Vitamin D deficiency 12/05/2015   Eczema 12/05/2015    Past Surgical History:  Procedure Laterality Date   WISDOM TOOTH EXTRACTION Bilateral     Family History  Problem Relation Age of Onset   Hypertension Mother    Asthma Sister    Lung disease Maternal Grandmother    Migraines Sister     Social History   Socioeconomic History   Marital status: Single    Spouse name: Not on file   Number of children: 0   Years of education: 12   Highest education level: High school graduate  Occupational History   Occupation: Office manager parts at work     Fish farm manager: [employer]  Tobacco Use   Smoking status: Never   Smokeless tobacco: Never  Vaping Use   Vaping Use: Never used  Substance and Sexual Activity   Alcohol use: Yes    Comment: occasionally   Drug use: No   Sexual activity: Yes    Partners: Male  Other Topics Concern   Not on file  Social History Narrative   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[employer], doing repairs   Dating [place] , they moved together August 2021   Social Determinants of Radio broadcast assistant Strain: Not on Art therapist Insecurity: Not on file  Transportation Needs: Not on file  Physical Activity: Not on file  Stress: Not on file  Social Connections: Not on file  Intimate Partner Violence: Not on file     Current Outpatient Medications:    albuterol (VENTOLIN HFA) 108 (90 Base) MCG/ACT inhaler, Inhale 2 puffs into the lungs every 6 (six) hours as needed for wheezing or shortness of breath., Disp: 1 each, Rfl: 0   clomiPHENE (CLOMID) 50 MG tablet, Take 3 tablets (150 mg total) by mouth daily., Disp: 15 tablet, Rfl: 1   EPINEPHrine 0.3 mg/0.3 mL IJ SOAJ injection, Inject 0.3 mg into the muscle as needed for anaphylaxis.,  Disp: 2 mL, Rfl: 1   ketoconazole (NIZORAL) 2 % shampoo, APPLY TOPICALLY 2 TIMES A WEEK., Disp: 120 mL, Rfl: 0   levocetirizine (XYZAL) 5 MG tablet, Take 1 tablet (5 mg total) by mouth every evening., Disp: 90 tablet, Rfl: 1   montelukast (SINGULAIR)  10 MG tablet, TAKE 1 TABLET BY MOUTH EVERYDAY AT BEDTIME, Disp: 90 tablet, Rfl: 1   Vitamin D, Ergocalciferol, (DRISDOL) 1.25 MG (50000 UNIT) CAPS capsule, TAKE 1 CAPSULE (50,000 UNITS TOTAL) BY MOUTH EVERY 7 (SEVEN) DAYS., Disp: 12 capsule, Rfl: 0  Allergies  Allergen Reactions   Peanut Oil    Peanut-Containing Drug Products Itching and Other (See Comments)    Throat swelling and becomes itchy      ROS  ***  Objective  There were no vitals filed for this visit.  There is no height or weight on file to calculate BMI.  Physical Exam ***  No results found for this or any previous visit (from the past 2160 hour(s)).   Fall Risk:    07/31/2020    3:25 PM 03/05/2020    3:51 PM 08/30/2019    2:02 PM 02/06/2019    2:26 PM 12/08/2018    8:57 AM  Fall Risk   Falls in the past year? 0 0 0 1 0  Number falls in past yr: 0 0 0 1 0  Injury with Fall? 0 0 0 1 0   ***  Functional Status Survey:   ***  Assessment & Plan  There are no diagnoses linked to this encounter.  -USPSTF grade A and B recommendations reviewed with patient; age-appropriate recommendations, preventive care, screening tests, etc discussed and encouraged; healthy living encouraged; see AVS for patient education given to patient -Discussed importance of 150 minutes of physical activity weekly, eat two servings of fish weekly, eat one serving of tree nuts ( cashews, pistachios, pecans, almonds.Marland Kitchen) every other day, eat 6 servings of fruit/vegetables daily and drink plenty of water and avoid sweet beverages.   -Reviewed Health Maintenance: {yes PN:300511}

## 2021-10-08 ENCOUNTER — Encounter: Payer: BC Managed Care – PPO | Admitting: Family Medicine

## 2021-10-08 DIAGNOSIS — R739 Hyperglycemia, unspecified: Secondary | ICD-10-CM

## 2021-10-08 DIAGNOSIS — E559 Vitamin D deficiency, unspecified: Secondary | ICD-10-CM

## 2021-10-08 DIAGNOSIS — Z124 Encounter for screening for malignant neoplasm of cervix: Secondary | ICD-10-CM

## 2021-10-08 DIAGNOSIS — Z01419 Encounter for gynecological examination (general) (routine) without abnormal findings: Secondary | ICD-10-CM

## 2022-05-17 ENCOUNTER — Other Ambulatory Visit: Payer: Self-pay | Admitting: Family Medicine

## 2022-05-17 DIAGNOSIS — L210 Seborrhea capitis: Secondary | ICD-10-CM

## 2023-01-12 NOTE — Progress Notes (Deleted)
Name: Shelley Davis   MRN: [id number]    DOB: [birth year]   Date:01/12/2023       Progress Note  Subjective  Chief Complaint  Medication Refill  HPI  Obesity: she had gained a lot of weight during the pandemic, weight is trending down now. She states planning on going back to the gym next week.    Thrombocytosis and iron deficiency:last levels were stable, recheck next visit   Peanut allergy: she has an epipen .   Prenatal counseling: stopped Depo in Nov 2020, she is living with boyfriend since 2020 , they have actively trying to get pregnant for over one year, she saw Dr. Tiburcio Pea January 2022 and had multiple tests done and was given Clomid to try to get pregnant on her next cycle. She is taking prenatal vitamin and vitamin D   Hyperglycemia with obesity: A1C has been within normal limits, discussed low carbohydrate diet Weight has been stable.    Asthma Mild intermittent/AR: no wheezing or cough, she is compliant with medications and denies side effects. She denies nasal congestion or rhinorrhea.   Snoring: she states it is loud snore at night, discussed sleep study versus evaluation by ENT. She saw ENT and was cleared but she continues to snore, discussed getting a body pillow and try to sleep on her side   GERD: resolved since she has been eating earlier , she states she does not eat from lunch until about 7-8 pm and after she eats she feels full and nauseated. Discussed smaller portions, not to recline after eating, also to avoid drinking at the same time as she eats.   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   Iron deficiency 12/08/2018   Hyperglycemia 12/08/2018   Class 1 obesity due to excess calories without serious comorbidity with body mass index (BMI) of 31.0 to 31.9 in adult 07/26/2018   Asthma, mild intermittent 08/05/2016   Low serum vitamin B12 08/05/2016   Primary dysmenorrhea 07/08/2016   Perennial  allergic rhinitis with seasonal variation 12/05/2015   Allergic to peanuts 12/05/2015   Vitamin D deficiency 12/05/2015   Eczema 12/05/2015    Past Surgical History:  Procedure Laterality Date   WISDOM TOOTH EXTRACTION Bilateral     Family History  Problem Relation Age of Onset   Hypertension Mother    Asthma Sister    Lung disease Maternal Grandmother    Migraines Sister     Social History   Tobacco Use   Smoking status: Never   Smokeless tobacco: Never  Substance Use Topics   Alcohol use: Yes    Comment: occasionally     Current Outpatient Medications:    albuterol (VENTOLIN HFA) 108 (90 Base) MCG/ACT inhaler, Inhale 2 puffs into the lungs every 6 (six) hours as needed for wheezing or shortness of breath., Disp: 1 each, Rfl: 0   clomiPHENE (CLOMID) 50 MG tablet, Take 3 tablets (150 mg total) by mouth daily., Disp: 15 tablet, Rfl: 1   EPINEPHrine 0.3 mg/0.3 mL IJ SOAJ injection, Inject 0.3 mg into the muscle as needed for anaphylaxis., Disp: 2 mL, Rfl: 1   ketoconazole (NIZORAL) 2 % shampoo, APPLY TOPICALLY 2 TIMES A WEEK., Disp: 120 mL, Rfl: 0   levocetirizine (XYZAL) 5 MG tablet, Take 1 tablet (5 mg total) by mouth every evening., Disp: 90 tablet, Rfl: 1   montelukast (SINGULAIR) 10 MG tablet, TAKE 1 TABLET BY MOUTH EVERYDAY AT BEDTIME, Disp:  90 tablet, Rfl: 1   Vitamin D, Ergocalciferol, (DRISDOL) 1.25 MG (50000 UNIT) CAPS capsule, TAKE 1 CAPSULE (50,000 UNITS TOTAL) BY MOUTH EVERY 7 (SEVEN) DAYS., Disp: 12 capsule, Rfl: 0  Allergies  Allergen Reactions   Peanut Oil    Peanut-Containing Drug Products Itching and Other (See Comments)    Throat swelling and becomes itchy     I personally reviewed active problem list, medication list, allergies, family history, social history, health maintenance with the patient/caregiver today.   ROS  ***  Objective  There were no vitals filed for this visit.  There is no height or weight on file to calculate  BMI.  Physical Exam ***  No results found for this or any previous visit (from the past 2160 hour(s)).   PHQ2/9:    07/31/2020    3:25 PM 03/05/2020    3:51 PM 08/30/2019    2:02 PM 02/06/2019    2:27 PM 12/08/2018    8:58 AM  Depression screen PHQ 2/9  Decreased Interest 0 0 0 0 0  Down, Depressed, Hopeless 0 0 0 0 0  PHQ - 2 Score 0 0 0 0 0  Altered sleeping   0 0 0  Tired, decreased energy   0 0 0  Change in appetite   0 0 0  Feeling bad or failure about yourself    0 0 0  Trouble concentrating   0 0 0  Moving slowly or fidgety/restless   0 0 0  Suicidal thoughts   0 0 0  PHQ-9 Score   0 0 0  Difficult doing work/chores     Not difficult at all    phq 9 is {gen pos ZHY:[id number]}   Fall Risk:    07/31/2020    3:25 PM 03/05/2020    3:51 PM 08/30/2019    2:02 PM 02/06/2019    2:26 PM 12/08/2018    8:57 AM  Fall Risk   Falls in the past year? 0 0 0 1 0  Number falls in past yr: 0 0 0 1 0  Injury with Fall? 0 0 0 1 0      Functional Status Survey:      Assessment & Plan  *** There are no diagnoses linked to this encounter.

## 2023-01-13 ENCOUNTER — Ambulatory Visit: Payer: BC Managed Care – PPO | Admitting: Family Medicine

## 2023-04-22 ENCOUNTER — Other Ambulatory Visit: Payer: Self-pay

## 2023-04-22 ENCOUNTER — Emergency Department
Admission: EM | Admit: 2023-04-22 | Discharge: 2023-04-22 | Disposition: A | Discharge status: Home / Self Care | Payer: BC Managed Care – PPO | Attending: Emergency Medicine | Admitting: Emergency Medicine

## 2023-04-22 DIAGNOSIS — R21 Rash and other nonspecific skin eruption: Secondary | ICD-10-CM | POA: Insufficient documentation

## 2023-04-22 DIAGNOSIS — J4520 Mild intermittent asthma, uncomplicated: Secondary | ICD-10-CM | POA: Diagnosis not present

## 2023-04-22 MED ORDER — FAMOTIDINE 20 MG PO TABS: 20.0000 mg | ORAL_TABLET | Freq: Two times a day (BID) | ORAL | 0 refills | Status: AC

## 2023-04-22 MED ORDER — DIPHENHYDRAMINE HCL 25 MG PO CAPS
25.0000 mg | ORAL_CAPSULE | Freq: Once | ORAL | Status: AC
  Administered 2023-04-22: 25 mg via ORAL
  Filled 2023-04-22: qty 1

## 2023-04-22 MED ORDER — FAMOTIDINE 20 MG PO TABS
20.0000 mg | ORAL_TABLET | Freq: Once | ORAL | Status: AC
  Administered 2023-04-22: 20 mg via ORAL
  Filled 2023-04-22: qty 1

## 2023-04-22 MED ORDER — PREDNISONE 50 MG PO TABS: ORAL_TABLET | ORAL | 0 refills | Status: AC

## 2023-04-22 MED ORDER — PREDNISONE 20 MG PO TABS
60.0000 mg | ORAL_TABLET | Freq: Once | ORAL | Status: AC
  Administered 2023-04-22: 60 mg via ORAL
  Filled 2023-04-22: qty 3

## 2023-04-22 NOTE — ED Provider Notes (Signed)
[facility] Provider Note    Event Date/Time   First MD Initiated Contact with Patient 04/22/23 1011     (approximate)   History   Rash   HPI  Shelley Davis is a 27 y.o. female who presents today for evaluation of rash.  Patient reports that she has numerous food allergies and ate a chocolate bar last night and then this morning she woke up with rash to her full body that is very itchy.  She denies any trouble breathing or swallowing.  No tongue or lip swelling.  No intraoral lesions.  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   Iron deficiency 12/08/2018   Hyperglycemia 12/08/2018   Class 1 obesity due to excess calories without serious comorbidity with body mass index (BMI) of 31.0 to 31.9 in adult 07/26/2018   Asthma, mild intermittent 08/05/2016   Low serum vitamin B12 08/05/2016   Primary dysmenorrhea 07/08/2016   Perennial allergic rhinitis with seasonal variation 12/05/2015   Allergic to peanuts 12/05/2015   Vitamin D deficiency 12/05/2015   Eczema 12/05/2015          Physical Exam   Triage Vital Signs: ED Triage Vitals  Encounter Vitals Group     BP 04/22/23 0950 130/82     Systolic BP Percentile --      Diastolic BP Percentile --      Pulse Rate 04/22/23 0950 92     Resp 04/22/23 0950 17     Temp 04/22/23 0950 97.7 F (36.5 C)     Temp Source 04/22/23 0950 Oral     SpO2 04/22/23 0950 96 %     Weight 04/22/23 0951 180 lb (81.6 kg)     Height 04/22/23 0951 5' (1.524 m)     Head Circumference --      Peak Flow --      Pain Score 04/22/23 0951 5     Pain Loc --      Pain Education --      Exclude from Growth Chart --     Most recent vital signs: Vitals:   04/22/23 0950  BP: 130/82  Pulse: 92  Resp: 17  Temp: 97.7 F (36.5 C)  SpO2: 96%    Physical Exam Vitals and nursing note reviewed.  Constitutional:      General: Awake and alert. No acute  distress.    Appearance: Normal appearance. The patient is obese.  HENT:     Head: Normocephalic and atraumatic.     Mouth: Mucous membranes are moist.  No tongue or lip swelling Eyes:     General: PERRL. Normal EOMs        Right eye: No discharge.        Left eye: No discharge.     Conjunctiva/sclera: Conjunctivae normal.  Cardiovascular:     Rate and Rhythm: Normal rate and regular rhythm.     Pulses: Normal pulses.  Pulmonary:     Effort: Pulmonary effort is normal. No respiratory distress.     Breath sounds: Normal breath sounds.  No wheezes. Abdominal:     Abdomen is soft. There is no abdominal tenderness. No rebound or guarding. No distention. Musculoskeletal:        General: No swelling. Normal range of motion.     Cervical back: Normal range of motion and neck supple.  Skin:    General: Skin is warm and dry.  Capillary Refill: Capillary refill takes less than 2 seconds.     Findings: Urticaria noted to arms, legs.  No facial rash noted.  No vesicles or bullae.  No skin sloughing Neurological:     Mental Status: The patient is awake and alert.      ED Results / Procedures / Treatments   Labs (all labs ordered are listed, but only abnormal results are displayed) Labs Reviewed - No data to display   EKG     RADIOLOGY     PROCEDURES:  Critical Care performed:   Procedures   MEDICATIONS ORDERED IN ED: Medications  diphenhydrAMINE (BENADRYL) capsule 25 mg (25 mg Oral Given 04/22/23 1027)  famotidine (PEPCID) tablet 20 mg (20 mg Oral Given 04/22/23 1027)  predniSONE (DELTASONE) tablet 60 mg (60 mg Oral Given 04/22/23 1027)     IMPRESSION / MDM / ASSESSMENT AND PLAN / ED COURSE  I reviewed the triage vital signs and the nursing notes.   Differential diagnosis includes, but is not limited to, contact dermatitis, allergic reaction, idiopathic urticaria.  Patient is awake and alert, hemodynamically stable and afebrile.  She is nontoxic in  appearance.  She has no tongue or lip swelling to suggest angioedema.  No abdominal pain, nausea, vomiting, diarrhea, wheezing, trouble breathing or swallowing to suggest anaphylaxis.  She does have diffuse urticaria to her extremities.  She was treated with Benadryl, Pepcid, and prednisone with improvement of her symptoms.  These were continued for the next 4 days.  She was advised that Benadryl make her sleepy and she cannot drive, operate heavy machinery, or perform any test that require concentration while taking this medication.  She is with her significant other who will drive her home today.  We discussed return precautions and outpatient follow-up.  Patient understands and agrees with plan.  She was discharged in stable condition.   Patient's presentation is most consistent with acute complicated illness / injury requiring diagnostic workup.   FINAL CLINICAL IMPRESSION(S) / ED DIAGNOSES   Final diagnoses:  Rash     Rx / DC Orders   ED Discharge Orders          Ordered    predniSONE (DELTASONE) 50 MG tablet        04/22/23 1106    famotidine (PEPCID) 20 MG tablet  2 times daily        04/22/23 1106             Note:  This document was prepared using Dragon voice recognition software and may include unintentional dictation errors.   Jackelyn Hoehn, PA-C 04/22/23 1305    Concha Se, MD 04/22/23 [phone]

## 2023-04-22 NOTE — Discharge Instructions (Addendum)
Take the medications as prescribed, which you can take with Benadryl per package instructions.  Please return for any new, worsening, or change in symptoms or other concerns including tongue or lip swelling, trouble breathing or swallowing, or any other concerns.  It was a pleasure caring for you today.

## 2023-04-22 NOTE — ED Triage Notes (Signed)
Pt states that after eating some chocolate last night she started to get a rash on her arms and legs. Pt states she took some benadryl 50mg  and it helped some, but did not take it away. Pt states no current itchy throat or shortness of breath. Pt states pain to hands and feet. Pt states the chocolate did not have nuts, but may have been produced in an area with nuts.

## 2024-05-13 ENCOUNTER — Emergency Department: Admission: EM | Admit: 2024-05-13 | Discharge: 2024-05-13 | Disposition: A | Discharge status: Home / Self Care

## 2024-05-13 ENCOUNTER — Other Ambulatory Visit: Payer: Self-pay

## 2024-05-13 ENCOUNTER — Emergency Department

## 2024-05-13 DIAGNOSIS — R109 Unspecified abdominal pain: Secondary | ICD-10-CM | POA: Diagnosis present

## 2024-05-13 DIAGNOSIS — R10A1 Flank pain, right side: Secondary | ICD-10-CM

## 2024-05-13 DIAGNOSIS — T148XXA Other injury of unspecified body region, initial encounter: Secondary | ICD-10-CM

## 2024-05-13 DIAGNOSIS — R309 Painful micturition, unspecified: Secondary | ICD-10-CM | POA: Diagnosis not present

## 2024-05-13 DIAGNOSIS — J45909 Unspecified asthma, uncomplicated: Secondary | ICD-10-CM | POA: Insufficient documentation

## 2024-05-13 LAB — BASIC METABOLIC PANEL WITH GFR
Anion gap: 10 (ref 5–15)
BUN: 15 mg/dL (ref 6–20)
CO2: 24 mmol/L (ref 22–32)
Calcium: 9.4 mg/dL (ref 8.9–10.3)
Chloride: 103 mmol/L (ref 98–111)
Creatinine, Ser: 0.7 mg/dL (ref 0.44–1.00)
GFR, Estimated: >60 mL/min
Glucose, Bld: 98 mg/dL (ref 70–99)
Potassium: 4.2 mmol/L (ref 3.5–5.1)
Sodium: 137 mmol/L (ref 135–145)

## 2024-05-13 LAB — HEPATIC FUNCTION PANEL
ALT: 6 U/L (ref 0–44)
AST: 14 U/L — ABNORMAL LOW (ref 15–41)
Albumin: 4.5 g/dL (ref 3.5–5.0)
Alkaline Phosphatase: 81 U/L (ref 38–126)
Bilirubin, Direct: 0.2 mg/dL (ref 0.0–0.2)
Indirect Bilirubin: 0.3 mg/dL (ref 0.3–0.9)
Total Bilirubin: 0.4 mg/dL (ref 0.0–1.2)
Total Protein: 7.8 g/dL (ref 6.5–8.1)

## 2024-05-13 LAB — CBC
HCT: 40.9 % (ref 36.0–46.0)
Hemoglobin: 13 g/dL (ref 12.0–15.0)
MCH: 25.1 pg — ABNORMAL LOW (ref 26.0–34.0)
MCHC: 31.8 g/dL (ref 30.0–36.0)
MCV: 79.1 fL — ABNORMAL LOW (ref 80.0–100.0)
Platelets: 456 K/uL — ABNORMAL HIGH (ref 150–400)
RBC: 5.17 MIL/uL — ABNORMAL HIGH (ref 3.87–5.11)
RDW: 14.1 % (ref 11.5–15.5)
WBC: 6.8 K/uL (ref 4.0–10.5)
nRBC: 0 % (ref 0.0–0.2)

## 2024-05-13 LAB — URINALYSIS, ROUTINE W REFLEX MICROSCOPIC
Bilirubin Urine: NEGATIVE
Glucose, UA: NEGATIVE mg/dL
Hgb urine dipstick: NEGATIVE
Ketones, ur: NEGATIVE mg/dL
Leukocytes,Ua: NEGATIVE
Nitrite: NEGATIVE
Protein, ur: NEGATIVE mg/dL
Specific Gravity, Urine: 1.027 (ref 1.005–1.030)
pH: 5 (ref 5.0–8.0)

## 2024-05-13 LAB — LIPASE, BLOOD: Lipase: 51 U/L (ref 11–51)

## 2024-05-13 LAB — POC URINE PREG, ED: Preg Test, Ur: NEGATIVE

## 2024-05-13 MED ORDER — ACETAMINOPHEN 500 MG PO TABS
1000.0000 mg | ORAL_TABLET | Freq: Once | ORAL | Status: AC
  Administered 2024-05-13: 1000 mg via ORAL
  Filled 2024-05-13: qty 2

## 2024-05-13 MED ORDER — LIDOCAINE 5 % EX PTCH
1.0000 | MEDICATED_PATCH | CUTANEOUS | Status: DC
  Administered 2024-05-13: 1 via TRANSDERMAL
  Filled 2024-05-13: qty 1

## 2024-05-13 MED ORDER — LIDOCAINE 5 % EX PTCH: 1.0000 | MEDICATED_PATCH | CUTANEOUS | 0 refills | Status: AC

## 2024-05-13 MED ORDER — IBUPROFEN 200 MG PO TABS: 600.0000 mg | ORAL_TABLET | Freq: Three times a day (TID) | ORAL | 2 refills | Status: AC | PRN

## 2024-05-13 MED ORDER — KETOROLAC TROMETHAMINE 30 MG/ML IJ SOLN
30.0000 mg | Freq: Once | INTRAMUSCULAR | Status: AC
  Administered 2024-05-13: 30 mg via INTRAMUSCULAR
  Filled 2024-05-13: qty 1

## 2024-05-13 NOTE — ED Triage Notes (Signed)
 Pt to ED for R flank pain since 5 days ago. States pain with urination. Cannot sit.

## 2024-05-13 NOTE — ED Provider Notes (Signed)
 "  [facility] Provider Note    Event Date/Time   First MD Initiated Contact with Patient 05/13/24 1331     (approximate)   History   Flank Pain  Pt to ED for R flank pain since 5 days ago. States pain with urination. Cannot sit.    HPI Shelley Davis is a 28 y.o. female PMH asthma, iron deficiency presents for evaluation of right flank pain -Present for 4 to 5 days, localizes to her right lateral abdomen/flank.  No fevers.  Has noted some mild pain with urination though this is primarily in her frank when it occurred.  No preceding trauma.  Pain is positional. - Has not taken any medications for this - No vaginal bleeding or discharge - Abdominal surgical history of C-section, no other abdominal surgical history - No vomiting or diarrhea - Has otherwise been in her usual state of health       Physical Exam   Triage Vital Signs: ED Triage Vitals  Encounter Vitals Group     BP 05/13/24 1150 124/74     Girls Systolic BP Percentile --      Girls Diastolic BP Percentile --      Boys Systolic BP Percentile --      Boys Diastolic BP Percentile --      Pulse Rate 05/13/24 1150 98     Resp 05/13/24 1150 20     Temp 05/13/24 1150 98 F (36.7 C)     Temp Source 05/13/24 1150 Oral     SpO2 05/13/24 1150 98 %     Weight 05/13/24 1152 200 lb (90.7 kg)     Height 05/13/24 1152 5' (1.524 m)     Head Circumference --      Peak Flow --      Pain Score 05/13/24 1150 8     Pain Loc --      Pain Education --      Exclude from Growth Chart --     Most recent vital signs: Vitals:   05/13/24 1150  BP: 124/74  Pulse: 98  Resp: 20  Temp: 98 F (36.7 C)  SpO2: 98%     General: Awake, no distress.  Appears uncomfortable though nontoxic. CV:  Good peripheral perfusion. RRR, RP 2+ Resp:  Normal effort. CTAB Abd:  No distention.  Mild right CVA tenderness.  No tenderness to deep palpation throughout abdomen.    ED Results / Procedures / Treatments    Labs (all labs ordered are listed, but only abnormal results are displayed) Labs Reviewed  URINALYSIS, ROUTINE W REFLEX MICROSCOPIC - Abnormal; Notable for the following components:      Result Value   Color, Urine YELLOW (*)    APPearance CLOUDY (*)    All other components within normal limits  CBC - Abnormal; Notable for the following components:   RBC 5.17 (*)    MCV 79.1 (*)    MCH 25.1 (*)    Platelets 456 (*)    All other components within normal limits  HEPATIC FUNCTION PANEL - Abnormal; Notable for the following components:   AST 14 (*)    All other components within normal limits  BASIC METABOLIC PANEL WITH GFR  LIPASE, BLOOD  POC URINE PREG, ED     EKG  N/a   RADIOLOGY Radiology interpreted by myself and radiology report reviewed.  No acute pathology identified.    PROCEDURES:  Critical Care performed: No  Procedures   MEDICATIONS  ORDERED IN ED: Medications  lidocaine  (LIDODERM ) 5 % 1 patch (1 patch Transdermal Patch Applied 05/13/24 1414)  acetaminophen  (TYLENOL ) tablet 1,000 mg (1,000 mg Oral Given 05/13/24 1414)  ketorolac  (TORADOL ) 30 MG/ML injection 30 mg (30 mg Intramuscular Given 05/13/24 1414)     IMPRESSION / MDM / ASSESSMENT AND PLAN / ED COURSE  I reviewed the triage vital signs and the nursing notes.                              DDX/MDM/AP: Differential diagnosis includes, but is not limited to, urolithiasis, UTI/pyelonephritis, consider but doubt atypical presentation cholecystitis/symptomatic, consider muscular strain Unlikely suspect appendicitis at this time.  Is sexually active, on Depo shot, highly doubt ectopic pregnancy, do not suspect other pelvic pathology at this time.   Plan: - Labs - Pain control FEN n.p.o. CT renal stone protocol  Patient's presentation is most consistent with acute presentation with potential threat to life or bodily function.   ED course below.  Workup unremarkable evidence and no evidence of  UTI, urolithiasis.  Hepatobiliary labs normal.  Feeling much better after pain meds, serial abdominal exams benign.  Overall suspect MSK strain.  Plan for Tylenol , Motrin , Lidoderm  patches.  ED return precautions in place.  Patient agrees with plan.  Clinical Course as of 05/13/24 1540  Sat May 13, 2024  1333 CBC, BMP reviewed, unremarkable [MM]  1351 Hcg neg [MM]  1405 UA w/ no hematuria, no evidence of infection, [MM]  1424 CT renal stone: IMPRESSION: No acute findings.  No findings to explain the patient's pain.   [MM]  1506 LFTs wnl [MM]  1537 Reevaluated, pain notably improved.  Does note that she frequently has to pick up her 97-year-old and does a lot of physical activity at work, overall suspect likely MSK strain.  Has Tylenol  at home, Rx Motrin , Lidoderm  patches.  Plan for PMD follow-up for any ongoing symptoms.  ED return precautions in place.  Patient agrees with plan. [MM]    Clinical Course User Index [MM] Clarine Ozell LABOR, MD     FINAL CLINICAL IMPRESSION(S) / ED DIAGNOSES   Final diagnoses:  Right flank pain  Muscle strain     Rx / DC Orders   ED Discharge Orders          Ordered    ibuprofen  (MOTRIN  IB) 200 MG tablet  Every 8 hours PRN        05/13/24 1538    lidocaine  (LIDODERM ) 5 %  Every 24 hours        05/13/24 1538             Note:  This document was prepared using Dragon voice recognition software and may include unintentional dictation errors.   Clarine Ozell LABOR, MD 05/13/24 1540  "

## 2024-05-13 NOTE — ED Provider Triage Note (Signed)
 Emergency Medicine Provider Triage Evaluation Note  RUDIE RIKARD , a 28 y.o. female  was evaluated in triage.  Pt complains of right flank pain x 5 days with dysuria. No known fever.  Physical Exam  BP 124/74 (BP Location: Left Arm)   Pulse 98   Temp 98 F (36.7 C) (Oral)   Resp 20   SpO2 98%  Gen:   Awake, no distress   Resp:  Normal effort  MSK:   Moves extremities without difficulty  Other:    Medical Decision Making  Medically screening exam initiated at 11:51 AM.  Appropriate orders placed.  TIFFINI BLACKSHER was informed that the remainder of the evaluation will be completed by another provider, this initial triage assessment does not replace that evaluation, and the importance of remaining in the ED until their evaluation is complete.    Herlinda Kirk NOVAK, FNP 05/13/24 1156

## 2024-05-13 NOTE — Discharge Instructions (Signed)
 Your evaluation in the emergency department was overall reassuring.  I suspect you likely had a muscle strain as the cause of your symptoms.  You can use Tylenol , Motrin , and Lidoderm  patches as needed for any ongoing symptoms.  Please do follow-up with your primary care provider for any persistent symptoms, and return to the emergency department with any new or worsening symptoms.

## 2024-08-24 ENCOUNTER — Ambulatory Visit: Admitting: Nurse Practitioner
